# Patient Record
Sex: Male | Born: 1965 | Hispanic: Yes | Marital: Married | Smoking: Former smoker
Health system: Southern US, Community
[De-identification: ages and names within clinical notes are randomized; demographics above are authoritative.]

## PROBLEM LIST (undated history)

## (undated) DIAGNOSIS — E119 Type 2 diabetes mellitus without complications: Secondary | ICD-10-CM

## (undated) DIAGNOSIS — I10 Essential (primary) hypertension: Secondary | ICD-10-CM

## (undated) HISTORY — PX: HERNIA REPAIR: SHX51

---

## 2016-10-19 ENCOUNTER — Emergency Department
Admission: EM | Admit: 2016-10-19 | Discharge: 2016-10-19 | Disposition: A | Discharge status: Home / Self Care | Payer: BLUE CROSS/BLUE SHIELD | Attending: Emergency Medicine | Admitting: Emergency Medicine

## 2016-10-19 ENCOUNTER — Encounter: Payer: Self-pay | Admitting: Emergency Medicine

## 2016-10-19 DIAGNOSIS — R358 Other polyuria: Secondary | ICD-10-CM | POA: Diagnosis present

## 2016-10-19 DIAGNOSIS — Z87891 Personal history of nicotine dependence: Secondary | ICD-10-CM | POA: Insufficient documentation

## 2016-10-19 DIAGNOSIS — R739 Hyperglycemia, unspecified: Secondary | ICD-10-CM

## 2016-10-19 LAB — BASIC METABOLIC PANEL
ANION GAP: 8 (ref 5–15)
BUN: 13 mg/dL (ref 6–20)
CHLORIDE: 99 mmol/L — AB (ref 101–111)
CO2: 28 mmol/L (ref 22–32)
Calcium: 9.4 mg/dL (ref 8.9–10.3)
Creatinine, Ser: 0.67 mg/dL (ref 0.61–1.24)
GFR calc Af Amer: >60 mL/min (ref >60)
Glucose, Bld: 326 mg/dL — ABNORMAL HIGH (ref 65–99)
POTASSIUM: 4 mmol/L (ref 3.5–5.1)
SODIUM: 135 mmol/L (ref 135–145)

## 2016-10-19 LAB — URINALYSIS, COMPLETE (UACMP) WITH MICROSCOPIC
BACTERIA UA: NONE SEEN
BILIRUBIN URINE: NEGATIVE
Glucose, UA: >=500 mg/dL — AB
Hgb urine dipstick: NEGATIVE
KETONES UR: NEGATIVE mg/dL
LEUKOCYTES UA: NEGATIVE
NITRITE: NEGATIVE
PH: 5 (ref 5.0–8.0)
PROTEIN: NEGATIVE mg/dL
Specific Gravity, Urine: 1.033 — ABNORMAL HIGH (ref 1.005–1.030)
Squamous Epithelial / LPF: NONE SEEN

## 2016-10-19 LAB — CBC
HEMATOCRIT: 49.7 % (ref 40.0–52.0)
HEMOGLOBIN: 16.6 g/dL (ref 13.0–18.0)
MCH: 31.8 pg (ref 26.0–34.0)
MCHC: 33.3 g/dL (ref 32.0–36.0)
MCV: 95.6 fL (ref 80.0–100.0)
Platelets: 229 10*3/uL (ref 150–440)
RBC: 5.2 MIL/uL (ref 4.40–5.90)
RDW: 13.3 % (ref 11.5–14.5)
WBC: 10.2 10*3/uL (ref 3.8–10.6)

## 2016-10-19 LAB — GLUCOSE, CAPILLARY
GLUCOSE-CAPILLARY: 228 mg/dL — AB (ref 65–99)
Glucose-Capillary: 329 mg/dL — ABNORMAL HIGH (ref 65–99)

## 2016-10-19 MED ORDER — METFORMIN HCL 500 MG PO TABS: 500.0000 mg | ORAL_TABLET | Freq: Two times a day (BID) | ORAL | 0 refills | Status: DC

## 2016-10-19 MED ORDER — BLOOD GLUCOSE MONITOR KIT: PACK | 0 refills | Status: DC

## 2016-10-19 MED ORDER — SODIUM CHLORIDE 0.9 % IV BOLUS (SEPSIS)
1000.0000 mL | Freq: Once | INTRAVENOUS | Status: AC
  Administered 2016-10-19: 1000 mL via INTRAVENOUS

## 2016-10-19 NOTE — ED Triage Notes (Signed)
Pt to ED c/o polydipsia, urinary frequency, diaphoretic and blurry vision x4-5 days.  States a coworker checked blood sugar and was 557.  Denies being diabetic or using insulin.  Pt is A&Ox4, speaking in complete and coherent sentences, chest rise even and unlabored.

## 2016-10-19 NOTE — ED Provider Notes (Signed)
[facility] Emergency Department Provider Note  ____________________________________________  Time seen: Approximately 2:07 PM  I have reviewed the triage vital signs and the nursing notes.   HISTORY  Chief Complaint Hyperglycemia    HPI Roy Walker is a 51 y.o. male who complains of polyuria and polydipsia with some intermittent blurry vision for the past 4-5 days. No other complaints except for fatigue. A coworker checked his blood sugar and found that it was about 550. No history of diabetes or other medical problems. Currently not taking any medications.     History reviewed. No pertinent past medical history.   There are no active problems to display for this patient.    Past Surgical History:  Procedure Laterality Date  . HERNIA REPAIR       Prior to Admission medications   Medication Sig Start Date End Date Taking? Authorizing Provider  blood glucose meter kit and supplies KIT Dispense based on patient and insurance preference. Check blood sugar three times daily before meals. 10/19/16   [name], MD  metFORMIN (GLUCOPHAGE) 500 MG tablet Take 1 tablet (500 mg total) by mouth 2 (two) times daily with a meal. 10/19/16   [name], MD     Allergies Patient has no known allergies.   History reviewed. No pertinent family history.  Social History Social History  Substance Use Topics  . Smoking status: Former Smoker    Types: Cigarettes    Quit date: 10/19/2005  . Smokeless tobacco: Never Used  . Alcohol use 6.0 oz/week    10 Cans of beer per week    Review of Systems  Constitutional:   No fever or chills. Polydipsia ENT:   No sore throat. No rhinorrhea.Had a cold one week ago which is resolved. Cardiovascular:   No chest pain. Respiratory:   No dyspnea or cough. Gastrointestinal:   Negative for abdominal pain, vomiting and diarrhea.  Genitourinary:   Negative for dysuria or difficulty urinating.  Polyuria Musculoskeletal:   Negative for focal pain or swelling Neurological:   Negative for headaches 10-point ROS otherwise negative.  ____________________________________________   PHYSICAL EXAM:  VITAL SIGNS: ED Triage Vitals  Enc Vitals Group     BP 10/19/16 1119 (!) 182/93     Pulse Rate 10/19/16 1119 64     Resp 10/19/16 1119 16     Temp 10/19/16 1119 98.4 F (36.9 C)     Temp Source 10/19/16 1119 Oral     SpO2 10/19/16 1119 96 %     Weight 10/19/16 1120 293 lb (132.9 kg)     Height 10/19/16 1120 '5\' 7"'  (1.702 m)     Head Circumference --      Peak Flow --      Pain Score --      Pain Loc --      Pain Edu? --      Excl. in [place]? --     Vital signs reviewed, nursing assessments reviewed.   Constitutional:   Alert and oriented. Well appearing and in no distress. Eyes:   No scleral icterus. No conjunctival pallor. PERRL. EOMI.  No nystagmus. ENT   Head:   Normocephalic and atraumatic.   Nose:   No congestion/rhinnorhea. No septal hematoma   Mouth/Throat:   MMM, no pharyngeal erythema. No peritonsillar mass.    Neck:   No stridor. No SubQ emphysema. No meningismus. Hematological/Lymphatic/Immunilogical:   No cervical lymphadenopathy. Cardiovascular:   RRR. Symmetric bilateral radial and DP pulses.  No  murmurs.  Respiratory:   Normal respiratory effort without tachypnea nor retractions. Breath sounds are clear and equal bilaterally. No wheezes/rales/rhonchi. Gastrointestinal:   Soft and nontender. Non distended. There is no CVA tenderness.  No rebound, rigidity, or guarding. Genitourinary:   deferred Musculoskeletal:   Nontender with normal range of motion in all extremities. No joint effusions.  No lower extremity tenderness.  No edema. Neurologic:   Normal speech and language.  CN 2-10 normal. Motor grossly intact. No gross focal neurologic deficits are appreciated.  Skin:    Skin is warm, dry and intact. No rash noted.  No petechiae, purpura, or  bullae.  ____________________________________________    LABS (pertinent positives/negatives) (all labs ordered are listed, but only abnormal results are displayed) Labs Reviewed  GLUCOSE, CAPILLARY - Abnormal; Notable for the following:       Result Value   Glucose-Capillary 329 (*)    All other components within normal limits  BASIC METABOLIC PANEL - Abnormal; Notable for the following:    Chloride 99 (*)    Glucose, Bld 326 (*)    All other components within normal limits  URINALYSIS, COMPLETE (UACMP) WITH MICROSCOPIC - Abnormal; Notable for the following:    Color, Urine YELLOW (*)    APPearance CLEAR (*)    Specific Gravity, Urine 1.033 (*)    Glucose, UA >=500 (*)    All other components within normal limits  CBC  CBG MONITORING, ED   ____________________________________________   EKG    ____________________________________________    RADIOLOGY    ____________________________________________   PROCEDURES Procedures  ____________________________________________   INITIAL IMPRESSION / ASSESSMENT AND PLAN / ED COURSE  Pertinent labs & imaging results that were available during my care of the patient were reviewed by me and considered in my medical decision making (see chart for details).  Is well-appearing no acute distress, presents with hyperglycemia, about 3:30 in the ED. No evidence of acidosis. No evidence of infection or other precipitating cause. Not on steroids. Well-appearing with unremarkable vital signs at this time although blood pressure significantly elevated as well.  Advised the patient first and foremost he needs a primary care doctor. Refer him to [place] [facility] or whichever clinic works best for him. Start metformin 500 mg twice a day. Encourage the patient to start checking his blood sugar 3 times a day before meals and keep a log for primary care. Counseled patient on effects of unmanaged blood sugar including long-term damage to hard  brain and kidneys. Information provided on fluid management.       ____________________________________________   FINAL CLINICAL IMPRESSION(S) / ED DIAGNOSES  Final diagnoses:  Hyperglycemia  New-onset type 2 diabetes    New Prescriptions   BLOOD GLUCOSE METER KIT AND SUPPLIES KIT    Dispense based on patient and insurance preference. Check blood sugar three times daily before meals.   METFORMIN (GLUCOPHAGE) 500 MG TABLET    Take 1 tablet (500 mg total) by mouth 2 (two) times daily with a meal.     Portions of this note were generated with dragon dictation software. Dictation errors may occur despite best attempts at proofreading.    [name], MD 10/19/16 [phone]

## 2016-10-19 NOTE — ED Notes (Signed)
Pt. Verbalizes understanding of d/c instructions, prescriptions, and follow-up. VS stable and pt denies pain.  Pt. In NAD at time of d/c and denies further concerns regarding this visit. Pt. Stable at the time of departure from the unit, departing unit by the safest and most appropriate manner per that pt condition and limitations. Pt advised to return to the ED at any time for emergent concerns, or for new/worsening symptoms.   

## 2016-10-19 NOTE — ED Notes (Signed)
Pt states polydipsia, polyuria since Thursday. 557CBG 5 days ago.

## 2017-11-10 ENCOUNTER — Emergency Department
Admission: EM | Admit: 2017-11-10 | Discharge: 2017-11-10 | Disposition: A | Discharge status: Home / Self Care | Payer: BLUE CROSS/BLUE SHIELD | Attending: Emergency Medicine | Admitting: Emergency Medicine

## 2017-11-10 ENCOUNTER — Other Ambulatory Visit: Payer: Self-pay

## 2017-11-10 DIAGNOSIS — E119 Type 2 diabetes mellitus without complications: Secondary | ICD-10-CM | POA: Diagnosis not present

## 2017-11-10 DIAGNOSIS — K429 Umbilical hernia without obstruction or gangrene: Secondary | ICD-10-CM | POA: Insufficient documentation

## 2017-11-10 DIAGNOSIS — K469 Unspecified abdominal hernia without obstruction or gangrene: Secondary | ICD-10-CM

## 2017-11-10 DIAGNOSIS — Z87891 Personal history of nicotine dependence: Secondary | ICD-10-CM | POA: Diagnosis not present

## 2017-11-10 DIAGNOSIS — I10 Essential (primary) hypertension: Secondary | ICD-10-CM | POA: Diagnosis not present

## 2017-11-10 DIAGNOSIS — Z7984 Long term (current) use of oral hypoglycemic drugs: Secondary | ICD-10-CM | POA: Insufficient documentation

## 2017-11-10 HISTORY — DX: Essential (primary) hypertension: I10

## 2017-11-10 HISTORY — DX: Type 2 diabetes mellitus without complications: E11.9

## 2017-11-10 NOTE — ED Notes (Signed)
See triage note  States he has a umbilical hernia and is requesting a surgeon referral for hernia repair

## 2017-11-10 NOTE — ED Triage Notes (Signed)
Pt states he wants to set up for hernia repair. Denies having any pain but wants to get it fixed before it gets bad..asked if he has spoke with a Careers advisersurgeon and he said that's why his is here.

## 2017-11-10 NOTE — Discharge Instructions (Signed)
Advised to follow-up with family doctor for medical clearance before consult to general surgeon.

## 2017-11-10 NOTE — ED Provider Notes (Signed)
[facility] Emergency Department Provider Note   ____________________________________________   First MD Initiated Contact with Patient 11/10/17 1004     (approximate)  I have reviewed the triage vital signs and the nursing notes.   HISTORY  Chief Complaint Hernia    HPI Roy Walker is a 52 y.o. male patient requesting consult the surgical consult secondary to umbilical hand area.  Patient was seen in December 2018 by family doctor and he was told a consult was generated.  Patient state he never heard from the surgical clinic.  Instead of contacting his doctor or come to the emergency room to request consult.  Patient denies abdominal pain with his hernia.Patient state umbilical hernia increased in size in the past 6 months.   Past Medical History:  Diagnosis Date  . Diabetes mellitus without complication ([place])   . Hypertension     There are no active problems to display for this patient.   Past Surgical History:  Procedure Laterality Date  . HERNIA REPAIR      Prior to Admission medications   Medication Sig Start Date End Date Taking? Authorizing Provider  blood glucose meter kit and supplies KIT Dispense based on patient and insurance preference. Check blood sugar three times daily before meals. 10/19/16   [name], MD  metFORMIN (GLUCOPHAGE) 500 MG tablet Take 1 tablet (500 mg total) by mouth 2 (two) times daily with a meal. 10/19/16   [name], MD    Allergies Patient has no known allergies.  No family history on file.  Social History Social History   Tobacco Use  . Smoking status: Former Smoker    Types: Cigarettes    Last attempt to quit: 10/19/2005    Years since quitting: 12.0  . Smokeless tobacco: Never Used  Substance Use Topics  . Alcohol use: Yes    Alcohol/week: 6.0 oz    Types: 10 Cans of beer per week  . Drug use: No    Review of Systems Constitutional: No fever/chills Eyes: No visual  changes. ENT: No sore throat. Cardiovascular: Denies chest pain. Respiratory: Denies shortness of breath. Gastrointestinal: No abdominal pain.  No nausea, no vomiting.  No diarrhea.  No constipation. Genitourinary: Negative for dysuria. Musculoskeletal: Negative for back pain. Skin: Negative for rash. Neurological: Negative for headaches, focal weakness or numbness. Endocrine:Diabetes and hypertension ____________________________________________   PHYSICAL EXAM:  VITAL SIGNS: ED Triage Vitals  Enc Vitals Group     BP 11/10/17 0900 (!) 198/95     Pulse Rate 11/10/17 0900 80     Resp 11/10/17 0900 18     Temp 11/10/17 0900 98.3 F (36.8 C)     Temp Source 11/10/17 0900 Oral     SpO2 11/10/17 0900 94 %     Weight 11/10/17 0906 230 lb (104.3 kg)     Height 11/10/17 0906 '5\' 7"'  (1.702 m)     Head Circumference --      Peak Flow --      Pain Score --      Pain Loc --      Pain Edu? --      Excl. in [place]? --    Constitutional: Alert and oriented. Well appearing and in no acute distress.  Overweight. Cardiovascular: Normal rate, regular rhythm. Grossly normal heart sounds.  Good peripheral circulation.  Elevated blood pressure Respiratory: Normal respiratory effort.  No retractions. Lungs CTAB. Gastrointestinal: Umbilical hernia.  Soft and nontender. No distention. No abdominal bruits. No  CVA tenderness.   ____________________________________________   LABS (all labs ordered are listed, but only abnormal results are displayed)  Labs Reviewed - No data to display ____________________________________________  EKG   ____________________________________________  RADIOLOGY  ED MD interpretation:    Official radiology report(s): No results found.  ____________________________________________   PROCEDURES  Procedure(s) performed: None  Procedures  Critical Care performed: No  ____________________________________________   INITIAL IMPRESSION / ASSESSMENT AND  PLAN / ED COURSE  As part of my medical decision making, I reviewed the following data within the [place]    Umbilical hernia and elevated blood pressure.  Advised patient he would need to be cleared by PCP due to his hypertension and history of diabetes.  Advised to have resubmit his consult.     ____________________________________________   FINAL CLINICAL IMPRESSION(S) / ED DIAGNOSES  Final diagnoses:  Abdominal hernia without obstruction and without gangrene, recurrence not specified, unspecified hernia type     ED Discharge Orders    None       Note:  This document was prepared using Dragon voice recognition software and may include unintentional dictation errors.    Sable Feil, PA-C 11/10/17 1028    [name], MD 11/10/17 [phone]

## 2018-05-01 ENCOUNTER — Ambulatory Visit: Payer: Self-pay | Admitting: Surgery

## 2018-05-01 NOTE — H&P (View-Only) (Signed)
Subjective:   CC: umbilical hernia.  HPI:  Roy Walker is a 52 y.o. male who was referred by Dr. Marcello FennelHande for evaluation of  Umbilical hernia. First noted 3 year ago. did not start at work. Asymptomatic.  Lump is reducible. Patient state no change in BM or urinary habits   Past Medical History:  has a past medical history of Diabetes mellitus type 2, uncomplicated (CMS-HCC), Hypertension, and Obesity (BMI 30-39.9), unspecified (03/14/2018).  Past Surgical History:  has a past surgical history that includes Repair Inguinal Hernia (Left).  Family History: family history is not on file.  Social History:  reports that he quit smoking about 12 years ago. His smoking use included cigarettes. He has never used smokeless tobacco. He reports that he drinks about 3.6 oz of alcohol per week. His drug history is not on file.  Current Medications: has a current medication list which includes the following prescription(s): aspirin, glimepiride, losartan, and metformin.  Allergies:  No Known Allergies  ROS: 14 systems reviewed pertinent positives and negatives as noted in the HPI.    Objective:   BP (!) 179/116   Pulse 87   Temp 37.6 C (99.7 F) (Oral)   Ht 170.2 cm (5\' 7" )   Wt (!) 103.9 kg (229 lb)   BMI 35.87 kg/m   Constitutional :  alert, appears stated age, cooperative and no distress  Throat:  no asymmetry, masses, or scars  Respiratory:  clear to auscultation bilaterally  Cardiovascular:  regular rate and rhythm, S1, S2 normal, no murmur, click, rub or gallop  Gastrointestinal: soft, non-tender; bowel sounds normal; no masses,  no organomegaly and umbilical hernia noted, easily reducible in supine but herniates with sitting.  obese, with evidence of diastasis recti as well, measuring approximately 3cm..   Musculoskeletal: Steady gait and movement  Skin: Cool and moist, left inguinal surgical scars  Psychiatric: Normal affect, non-agitated, not confused       LABS:   -      Lab Results  Component Value Date   WBC 9.8 12/14/2017   HGB 15.9 12/14/2017   HCT 48.5 12/14/2017   PLT 246 12/14/2017   -      Lab Results  Component Value Date   NA 141 12/14/2017   K 4.2 12/14/2017   CL 101 12/14/2017   CO2 32.7 (H) 12/14/2017   BUN 16 12/14/2017   CREATININE 0.9 12/14/2017   CALCIUM 10.3 12/14/2017   ALB 4.7 12/14/2017   TBILI 0.6 12/14/2017   ALKPHOS 71 12/14/2017   AST 38 12/14/2017   ALT 78 (H) 12/14/2017   GLUCOSE 195 (H) 12/14/2017   GFR 89 12/14/2017   -      Lab Results  Component Value Date   CHOLTOTAL 235 (H) 12/14/2017   TRIG 324 (H) 12/14/2017   HDL 46.0 12/14/2017   LDLCALC 124 12/14/2017   -      Lab Results  Component Value Date   HGBA1C 9.6 (H) 12/14/2017     RADS:  Assessment:     reducible moderate size umbilical hernia, obesity, DM, HTN  Plan:   1. Alternatives include continued observation.  Benefits include possible symptom relief, prevention of incarceration, strangulation, enlargement in size over time, and the risk of emergency surgery in the face of strangulation.   Discussed the risk of surgery including recurrence, which can be up to 50% in the case of incisional or complex hernias, possible use of prosthetic materials (mesh) and the increased risk of mesh  infxn if used, chronic pain, post-op infxn, post-op SBO or ileus, and possible re-operation to address said risks. The risks of general anesthetic, if used, includes MI, CVA, sudden death or even reaction to anesthetic medications also discussed.  Typical post-op recovery time of 3-5 days with 6 weeks of activity restrictions were also discussed.  The patient verbalized understanding and all questions were answered to the patient's satisfaction.  ED return precautions given for sudden increase in pain, size of hernia with accompanying fever, nausea, and/or vomiting.  2. Patient has elected to proceed with surgical  treatment. Procedure will be scheduled once short-term disability paperwork is completed for his work, which requires regular lifting of greater than 30lbs.  Written consent was obtained.  3. Obesity -discussed increased risk of complications including recurrence.  Pt requests to proceed with surgery so he can lose weight.  4. DM -recently changed med regimen.  Continue current meds  5. HTN -known to be measured higher at provider office per patient report.  Continue to monitor and take meds as prescribed.   Of the 35 minutes I spent with the patient, 15 minutes were spent discussing pathophysiology, risks, benefits, alternatives to the treatment options     Electronically signed by Sung Amabile, DO on 03/14/2018 9:27 AM

## 2018-05-01 NOTE — H&P (Signed)
Subjective:   CC: umbilical hernia.  HPI:  Roy Walker is a 52 y.o. male who was referred by Dr. Hande for evaluation of  Umbilical hernia. First noted 3 year ago. did not start at work. Asymptomatic.  Lump is reducible. Patient state no change in BM or urinary habits   Past Medical History:  has a past medical history of Diabetes mellitus type 2, uncomplicated (CMS-HCC), Hypertension, and Obesity (BMI 30-39.9), unspecified (03/14/2018).  Past Surgical History:  has a past surgical history that includes Repair Inguinal Hernia (Left).  Family History: family history is not on file.  Social History:  reports that he quit smoking about 12 years ago. His smoking use included cigarettes. He has never used smokeless tobacco. He reports that he drinks about 3.6 oz of alcohol per week. His drug history is not on file.  Current Medications: has a current medication list which includes the following prescription(s): aspirin, glimepiride, losartan, and metformin.  Allergies:  No Known Allergies  ROS: 14 systems reviewed pertinent positives and negatives as noted in the HPI.    Objective:   BP (!) 179/116   Pulse 87   Temp 37.6 C (99.7 F) (Oral)   Ht 170.2 cm (5' 7")   Wt (!) 103.9 kg (229 lb)   BMI 35.87 kg/m   Constitutional :  alert, appears stated age, cooperative and no distress  Throat:  no asymmetry, masses, or scars  Respiratory:  clear to auscultation bilaterally  Cardiovascular:  regular rate and rhythm, S1, S2 normal, no murmur, click, rub or gallop  Gastrointestinal: soft, non-tender; bowel sounds normal; no masses,  no organomegaly and umbilical hernia noted, easily reducible in supine but herniates with sitting.  obese, with evidence of diastasis recti as well, measuring approximately 3cm..   Musculoskeletal: Steady gait and movement  Skin: Cool and moist, left inguinal surgical scars  Psychiatric: Normal affect, non-agitated, not confused       LABS:   -      Lab Results  Component Value Date   WBC 9.8 12/14/2017   HGB 15.9 12/14/2017   HCT 48.5 12/14/2017   PLT 246 12/14/2017   -      Lab Results  Component Value Date   NA 141 12/14/2017   K 4.2 12/14/2017   CL 101 12/14/2017   CO2 32.7 (H) 12/14/2017   BUN 16 12/14/2017   CREATININE 0.9 12/14/2017   CALCIUM 10.3 12/14/2017   ALB 4.7 12/14/2017   TBILI 0.6 12/14/2017   ALKPHOS 71 12/14/2017   AST 38 12/14/2017   ALT 78 (H) 12/14/2017   GLUCOSE 195 (H) 12/14/2017   GFR 89 12/14/2017   -      Lab Results  Component Value Date   CHOLTOTAL 235 (H) 12/14/2017   TRIG 324 (H) 12/14/2017   HDL 46.0 12/14/2017   LDLCALC 124 12/14/2017   -      Lab Results  Component Value Date   HGBA1C 9.6 (H) 12/14/2017     RADS:  Assessment:     reducible moderate size umbilical hernia, obesity, DM, HTN  Plan:   1. Alternatives include continued observation.  Benefits include possible symptom relief, prevention of incarceration, strangulation, enlargement in size over time, and the risk of emergency surgery in the face of strangulation.   Discussed the risk of surgery including recurrence, which can be up to 50% in the case of incisional or complex hernias, possible use of prosthetic materials (mesh) and the increased risk of mesh   infxn if used, chronic pain, post-op infxn, post-op SBO or ileus, and possible re-operation to address said risks. The risks of general anesthetic, if used, includes MI, CVA, sudden death or even reaction to anesthetic medications also discussed.  Typical post-op recovery time of 3-5 days with 6 weeks of activity restrictions were also discussed.  The patient verbalized understanding and all questions were answered to the patient's satisfaction.  ED return precautions given for sudden increase in pain, size of hernia with accompanying fever, nausea, and/or vomiting.  2. Patient has elected to proceed with surgical  treatment. Procedure will be scheduled once short-term disability paperwork is completed for his work, which requires regular lifting of greater than 30lbs.  Written consent was obtained.  3. Obesity -discussed increased risk of complications including recurrence.  Pt requests to proceed with surgery so he can lose weight.  4. DM -recently changed med regimen.  Continue current meds  5. HTN -known to be measured higher at provider office per patient report.  Continue to monitor and take meds as prescribed.   Of the 35 minutes I spent with the patient, 15 minutes were spent discussing pathophysiology, risks, benefits, alternatives to the treatment options     Electronically signed by Abygayle Deltoro, DO on 03/14/2018 9:27 AM     

## 2018-05-09 ENCOUNTER — Inpatient Hospital Stay: Admission: RE | Admit: 2018-05-09 | Payer: BLUE CROSS/BLUE SHIELD | Source: Ambulatory Visit

## 2018-05-11 ENCOUNTER — Inpatient Hospital Stay: Admission: RE | Admit: 2018-05-11 | Payer: BLUE CROSS/BLUE SHIELD | Source: Ambulatory Visit

## 2018-05-17 MED ORDER — CEFAZOLIN SODIUM-DEXTROSE 2-4 GM/100ML-% IV SOLN: 2.0000 g | INTRAVENOUS | Status: DC

## 2018-05-18 ENCOUNTER — Ambulatory Visit
Admission: RE | Admit: 2018-05-18 | Discharge: 2018-05-18 | Disposition: A | Discharge status: Home / Self Care | Payer: BLUE CROSS/BLUE SHIELD | Source: Ambulatory Visit | Attending: Surgery | Admitting: Surgery

## 2018-05-18 ENCOUNTER — Encounter
Admission: RE | Disposition: A | Discharge status: Home / Self Care | Payer: Self-pay | Source: Ambulatory Visit | Attending: Surgery

## 2018-05-18 ENCOUNTER — Ambulatory Visit: Payer: BLUE CROSS/BLUE SHIELD

## 2018-05-18 ENCOUNTER — Other Ambulatory Visit: Payer: Self-pay

## 2018-05-18 DIAGNOSIS — Z7984 Long term (current) use of oral hypoglycemic drugs: Secondary | ICD-10-CM | POA: Insufficient documentation

## 2018-05-18 DIAGNOSIS — Z87891 Personal history of nicotine dependence: Secondary | ICD-10-CM | POA: Diagnosis not present

## 2018-05-18 DIAGNOSIS — Z6833 Body mass index (BMI) 33.0-33.9, adult: Secondary | ICD-10-CM | POA: Diagnosis not present

## 2018-05-18 DIAGNOSIS — K420 Umbilical hernia with obstruction, without gangrene: Secondary | ICD-10-CM | POA: Diagnosis not present

## 2018-05-18 DIAGNOSIS — Z79899 Other long term (current) drug therapy: Secondary | ICD-10-CM | POA: Insufficient documentation

## 2018-05-18 DIAGNOSIS — Z7982 Long term (current) use of aspirin: Secondary | ICD-10-CM | POA: Insufficient documentation

## 2018-05-18 DIAGNOSIS — E119 Type 2 diabetes mellitus without complications: Secondary | ICD-10-CM | POA: Insufficient documentation

## 2018-05-18 DIAGNOSIS — I10 Essential (primary) hypertension: Secondary | ICD-10-CM | POA: Diagnosis not present

## 2018-05-18 DIAGNOSIS — E669 Obesity, unspecified: Secondary | ICD-10-CM | POA: Diagnosis not present

## 2018-05-18 LAB — GLUCOSE, CAPILLARY: Glucose-Capillary: 321 mg/dL — ABNORMAL HIGH (ref 70–99)

## 2018-05-18 SURGERY — REPAIR, HERNIA, UMBILICAL, ADULT
Anesthesia: Choice

## 2018-05-18 MED ORDER — PHENYLEPHRINE HCL 10 MG/ML IJ SOLN
INTRAMUSCULAR | Status: AC
  Filled 2018-05-18: qty 1

## 2018-05-18 MED ORDER — SEVOFLURANE IN SOLN
RESPIRATORY_TRACT | Status: AC
  Filled 2018-05-18: qty 250

## 2018-05-18 MED ORDER — INSULIN ASPART 100 UNIT/ML ~~LOC~~ SOLN
10.0000 [IU] | Freq: Once | SUBCUTANEOUS | Status: AC
  Administered 2018-05-18: 10 [IU] via SUBCUTANEOUS

## 2018-05-18 MED ORDER — PROPOFOL 10 MG/ML IV BOLUS
INTRAVENOUS | Status: AC
  Filled 2018-05-18: qty 20

## 2018-05-18 MED ORDER — ACETAMINOPHEN 500 MG PO TABS
1000.0000 mg | ORAL_TABLET | ORAL | Status: AC
  Administered 2018-05-18: 1000 mg via ORAL

## 2018-05-18 MED ORDER — SUCCINYLCHOLINE CHLORIDE 20 MG/ML IJ SOLN
INTRAMUSCULAR | Status: AC
  Filled 2018-05-18: qty 1

## 2018-05-18 MED ORDER — FENTANYL CITRATE (PF) 250 MCG/5ML IJ SOLN
INTRAMUSCULAR | Status: AC
  Filled 2018-05-18: qty 5

## 2018-05-18 MED ORDER — BUPIVACAINE-EPINEPHRINE (PF) 0.5% -1:200000 IJ SOLN
INTRAMUSCULAR | Status: AC
  Filled 2018-05-18: qty 30

## 2018-05-18 MED ORDER — SODIUM CHLORIDE 0.9 % IV SOLN
INTRAVENOUS | Status: DC
  Administered 2018-05-18: 08:00:00 via INTRAVENOUS

## 2018-05-18 MED ORDER — ONDANSETRON HCL 4 MG/2ML IJ SOLN
INTRAMUSCULAR | Status: AC
  Filled 2018-05-18: qty 2

## 2018-05-18 MED ORDER — MIDAZOLAM HCL 2 MG/2ML IJ SOLN
INTRAMUSCULAR | Status: AC
  Filled 2018-05-18: qty 2

## 2018-05-18 MED ORDER — ACETAMINOPHEN 500 MG PO TABS
ORAL_TABLET | ORAL | Status: AC
  Administered 2018-05-18: 1000 mg via ORAL
  Filled 2018-05-18: qty 2

## 2018-05-18 MED ORDER — DEXAMETHASONE SODIUM PHOSPHATE 10 MG/ML IJ SOLN
INTRAMUSCULAR | Status: AC
  Filled 2018-05-18: qty 1

## 2018-05-18 MED ORDER — BUPIVACAINE LIPOSOME 1.3 % IJ SUSP
INTRAMUSCULAR | Status: AC
  Filled 2018-05-18: qty 20

## 2018-05-18 MED ORDER — CEFAZOLIN SODIUM-DEXTROSE 2-4 GM/100ML-% IV SOLN
INTRAVENOUS | Status: AC
  Filled 2018-05-18: qty 100

## 2018-05-18 MED ORDER — INSULIN ASPART 100 UNIT/ML ~~LOC~~ SOLN
SUBCUTANEOUS | Status: AC
  Administered 2018-05-18: 10 [IU] via SUBCUTANEOUS
  Filled 2018-05-18: qty 1

## 2018-05-18 MED ORDER — ROCURONIUM BROMIDE 50 MG/5ML IV SOLN
INTRAVENOUS | Status: AC
  Filled 2018-05-18: qty 1

## 2018-05-18 MED ORDER — LIDOCAINE HCL (PF) 2 % IJ SOLN
INTRAMUSCULAR | Status: AC
  Filled 2018-05-18: qty 10

## 2018-05-18 MED ORDER — CHLORHEXIDINE GLUCONATE CLOTH 2 % EX PADS
6.0000 | MEDICATED_PAD | Freq: Once | CUTANEOUS | Status: AC
  Administered 2018-05-18: 6 via TOPICAL

## 2018-05-18 SURGICAL SUPPLY — 30 items
BLADE SURG 15 STRL LF DISP TIS (BLADE) ×1 IMPLANT
BLADE SURG 15 STRL SS (BLADE) ×2
CANISTER SUCT 1200ML W/VALVE (MISCELLANEOUS) ×3 IMPLANT
CHLORAPREP W/TINT 26ML (MISCELLANEOUS) ×3 IMPLANT
DERMABOND ADVANCED (GAUZE/BANDAGES/DRESSINGS) ×2
DERMABOND ADVANCED .7 DNX12 (GAUZE/BANDAGES/DRESSINGS) ×1 IMPLANT
DRAPE LAPAROTOMY 77X122 PED (DRAPES) ×3 IMPLANT
ELECT REM PT RETURN 9FT ADLT (ELECTROSURGICAL) ×3
ELECTRODE REM PT RTRN 9FT ADLT (ELECTROSURGICAL) ×1 IMPLANT
GLOVE BIOGEL PI IND STRL 7.0 (GLOVE) ×1 IMPLANT
GLOVE BIOGEL PI INDICATOR 7.0 (GLOVE) ×2
GLOVE SURG SYN 6.5 ES PF (GLOVE) ×3 IMPLANT
GOWN STRL REUS W/ TWL LRG LVL3 (GOWN DISPOSABLE) ×3 IMPLANT
GOWN STRL REUS W/TWL LRG LVL3 (GOWN DISPOSABLE) ×6
KIT TURNOVER KIT A (KITS) ×3 IMPLANT
LABEL OR SOLS (LABEL) ×3 IMPLANT
NEEDLE HYPO 22GX1.5 SAFETY (NEEDLE) ×3 IMPLANT
NS IRRIG 500ML POUR BTL (IV SOLUTION) ×3 IMPLANT
PACK BASIN MINOR ARMC (MISCELLANEOUS) ×3 IMPLANT
SUT ETHIBOND NAB MO 7 #0 18IN (SUTURE) ×3 IMPLANT
SUT MNCRL 4-0 (SUTURE) ×2
SUT MNCRL 4-0 27XMFL (SUTURE) ×1
SUT VIC AB 2-0 SH 27 (SUTURE) ×2
SUT VIC AB 2-0 SH 27XBRD (SUTURE) ×1 IMPLANT
SUT VIC AB 3-0 SH 27 (SUTURE) ×2
SUT VIC AB 3-0 SH 27X BRD (SUTURE) ×1 IMPLANT
SUTURE MNCRL 4-0 27XMF (SUTURE) ×1 IMPLANT
SYR 10ML LL (SYRINGE) ×6 IMPLANT
TOWEL OR 17X26 4PK STRL BLUE (TOWEL DISPOSABLE) ×3 IMPLANT
WATER STERILE IRR 1000ML POUR (IV SOLUTION) ×3 IMPLANT

## 2018-05-18 NOTE — OR Nursing (Signed)
The patient had a bite of watermelon at 0500 with a sip of water. Surgery cancelled.

## 2018-05-18 NOTE — Interval H&P Note (Signed)
History and Physical Interval Note:  05/18/2018 8:23 AM  Roy Walker  has presented today for surgery, with the diagnosis of umbilical hernia  The various methods of treatment have been discussed with the patient and family. After consideration of risks, benefits and other options for treatment, the patient has consented to  Procedure(s): HERNIA REPAIR UMBILICAL ADULT (N/A) as a surgical intervention .  The patient's history has been reviewed, patient examined, no change in status, stable for surgery.  I have reviewed the patient's chart and labs.  Questions were answered to the patient's satisfaction.     Miche Loughridge Tonna BoehringerSakai

## 2018-05-18 NOTE — Anesthesia Preprocedure Evaluation (Signed)
Anesthesia Evaluation    Airway        Dental   Pulmonary former smoker,           Cardiovascular hypertension,      Neuro/Psych    GI/Hepatic   Endo/Other  diabetes  Renal/GU      Musculoskeletal   Abdominal   Peds  Hematology   Anesthesia Other Findings   Reproductive/Obstetrics                             Anesthesia Physical Anesthesia Plan Anesthesia Quick Evaluation  

## 2018-05-24 ENCOUNTER — Encounter
Admission: RE | Admit: 2018-05-24 | Discharge: 2018-05-24 | Disposition: A | Discharge status: Home / Self Care | Payer: BLUE CROSS/BLUE SHIELD | Source: Ambulatory Visit | Attending: Surgery | Admitting: Surgery

## 2018-05-24 ENCOUNTER — Ambulatory Visit: Payer: Self-pay | Admitting: Surgery

## 2018-05-24 DIAGNOSIS — Z01818 Encounter for other preprocedural examination: Secondary | ICD-10-CM | POA: Insufficient documentation

## 2018-05-24 NOTE — Patient Instructions (Addendum)
Your procedure is scheduled on: Friday 05/26/18  Report to DAY SURGERY DEPARTMENT LOCATED ON 2ND FLOOR MEDICAL MALL ENTRANCE. To find out your arrival time please call [phone] between 1PM - 3PM on Thursday 05/25/18  Remember: Instructions that are not followed completely may result in serious medical risk, up to and including death, or upon the discretion of your surgeon and anesthesiologist your surgery may need to be rescheduled.     _X__ 1. Do not eat food after midnight the night before your procedure.                 No gum chewing or hard candies. You may drink SUGAR FREE clear liquids up to 2 hours                 before you are scheduled to arrive for your surgery- DO NOT drink clear                 liquids within 2 hours of the start of your surgery.                  __X__2.  On the morning of surgery brush your teeth with toothpaste and water, you may rinse your mouth with mouthwash if you wish.  Do not swallow any toothpaste of mouthwash.     _X__ 3.  No Alcohol for 24 hours before or after surgery.   _X__ 4.  Do Not Smoke or use e-cigarettes For 24 Hours Prior to Your Surgery.                 Do not use any chewable tobacco products for at least 6 hours prior to                  surgery.  ____   5.  Bring all medications with you on the day of surgery if instructed.   __X__ 6.  Notify your doctor if there is any change in your medical condition      (cold, fever, infections).     Do not wear jewelry, make-up, hairpins, clips or nail polish. Do not wear lotions, powders, or perfumes.  Do not shave 48 hours prior to surgery. Men may shave face and neck. Do not bring valuables to the hospital.    [facility] is not responsible for any belongings or valuables.  Contacts, dentures/partials or body piercings may not be worn into surgery. Bring a case for your contacts, glasses or hearing aids, a denture cup will be supplied.   Patients discharged the day of surgery will  not be allowed to drive home.   Please read over the following fact sheets that you were given:   MRSA Information  __X__ Take these medicines the morning of surgery with A SIP OF WATER:     1. fenofibrate (TRICOR) 48 MG tablet  2.   3.  4.  5.  6.     __X__ Use CHG Soap as directed   __X__ Stop metformin TODAY    __X__ Stop Blood Thinners Coumadin/Plavix/Xarelto/Pleta/Pradaxa/Eliquis/Effient/Aspirin TODAY  __X__ Stop Anti-inflammatories 7 days before surgery such as Advil, Ibuprofen, Motrin, BC or Goodies Powder, Naprosyn, Naproxen, Aleve, Aspirin, Meloxicam. May take Tylenol if needed for pain or discomfort.   __X__ Stop all herbal supplements, fish oil or vitamin E until after surgery. TODAY

## 2018-05-24 NOTE — H&P (Signed)
Subjective:   CC: umbilical hernia.  HPI:  Roy Walker is a 52 y.o. male who was referred by Dr. Marcello Fennel for evaluation of  Umbilical hernia. First noted 3 year ago. did not start at work. Asymptomatic.  Lump is reducible. Patient state no change in BM or urinary habits   Past Medical History:  has a past medical history of Diabetes mellitus type 2, uncomplicated (CMS-HCC), Hypertension, and Obesity (BMI 30-39.9), unspecified (03/14/2018).  Past Surgical History:  has a past surgical history that includes Repair Inguinal Hernia (Left).  Family History: family history is not on file.  Social History:  reports that he quit smoking about 12 years ago. His smoking use included cigarettes. He has never used smokeless tobacco. He reports that he drinks about 3.6 oz of alcohol per week. His drug history is not on file.  Current Medications: has a current medication list which includes the following prescription(s): aspirin, glimepiride, losartan, and metformin.  Allergies:  No Known Allergies  ROS: 14 systems reviewed pertinent positives and negatives as noted in the HPI.    Objective:   BP (!) 179/116   Pulse 87   Temp 37.6 C (99.7 F) (Oral)   Ht 170.2 cm (5\' 7" )   Wt (!) 103.9 kg (229 lb)   BMI 35.87 kg/m   Constitutional :  alert, appears stated age, cooperative and no distress  Throat:  no asymmetry, masses, or scars  Respiratory:  clear to auscultation bilaterally  Cardiovascular:  regular rate and rhythm, S1, S2 normal, no murmur, click, rub or gallop  Gastrointestinal: soft, non-tender; bowel sounds normal; no masses,  no organomegaly and umbilical hernia noted, easily reducible in supine but herniates with sitting.  obese, with evidence of diastasis recti as well, measuring approximately 3cm..   Musculoskeletal: Steady gait and movement  Skin: Cool and moist, left inguinal surgical scars  Psychiatric: Normal affect, non-agitated, not confused       LABS:   -      Lab Results  Component Value Date   WBC 9.8 12/14/2017   HGB 15.9 12/14/2017   HCT 48.5 12/14/2017   PLT 246 12/14/2017   -      Lab Results  Component Value Date   NA 141 12/14/2017   K 4.2 12/14/2017   CL 101 12/14/2017   CO2 32.7 (H) 12/14/2017   BUN 16 12/14/2017   CREATININE 0.9 12/14/2017   CALCIUM 10.3 12/14/2017   ALB 4.7 12/14/2017   TBILI 0.6 12/14/2017   ALKPHOS 71 12/14/2017   AST 38 12/14/2017   ALT 78 (H) 12/14/2017   GLUCOSE 195 (H) 12/14/2017   GFR 89 12/14/2017   -      Lab Results  Component Value Date   CHOLTOTAL 235 (H) 12/14/2017   TRIG 324 (H) 12/14/2017   HDL 46.0 12/14/2017   LDLCALC 124 12/14/2017   -      Lab Results  Component Value Date   HGBA1C 9.6 (H) 12/14/2017     RADS:  Assessment:     reducible moderate size umbilical hernia, obesity, DM, HTN  Plan:   1. Alternatives include continued observation.  Benefits include possible symptom relief, prevention of incarceration, strangulation, enlargement in size over time, and the risk of emergency surgery in the face of strangulation.   Discussed the risk of surgery including recurrence, which can be up to 50% in the case of incisional or complex hernias, possible use of prosthetic materials (mesh) and the increased risk of mesh  infxn if used, chronic pain, post-op infxn, post-op SBO or ileus, and possible re-operation to address said risks. The risks of general anesthetic, if used, includes MI, CVA, sudden death or even reaction to anesthetic medications also discussed.  Typical post-op recovery time of 3-5 days with 6 weeks of activity restrictions were also discussed.  The patient verbalized understanding and all questions were answered to the patient's satisfaction.  ED return precautions given for sudden increase in pain, size of hernia with accompanying fever, nausea, and/or vomiting.  2. Patient has elected to proceed with surgical  treatment. Procedure will be scheduled once short-term disability paperwork is completed for his work, which requires regular lifting of greater than 30lbs.  Written consent was obtained.  3. Obesity -discussed increased risk of complications including recurrence.  Pt requests to proceed with surgery so he can lose weight.  4. DM -recently changed med regimen.  Continue current meds  5. HTN -known to be measured higher at provider office per patient report.  Continue to monitor and take meds as prescribed.   Of the 35 minutes I spent with the patient, 15 minutes were spent discussing pathophysiology, risks, benefits, alternatives to the treatment options     Electronically signed by [name], Brenner Visconti, DO on 03/14/2018 9:27 AM

## 2018-05-24 NOTE — H&P (View-Only) (Signed)
Subjective:   CC: umbilical hernia.  HPI:  Roy Walker is a 52 y.o. male who was referred by Dr. Marcello Walker for evaluation of  Umbilical hernia. First noted 3 year ago. did not start at work. Asymptomatic.  Lump is reducible. Patient state no change in BM or urinary habits   Past Medical History:  has a past medical history of Diabetes mellitus type 2, uncomplicated (CMS-HCC), Hypertension, and Obesity (BMI 30-39.9), unspecified (03/14/2018).  Past Surgical History:  has a past surgical history that includes Repair Inguinal Hernia (Left).  Family History: family history is not on file.  Social History:  reports that he quit smoking about 12 years ago. His smoking use included cigarettes. He has never used smokeless tobacco. He reports that he drinks about 3.6 oz of alcohol per week. His drug history is not on file.  Current Medications: has a current medication list which includes the following prescription(s): aspirin, glimepiride, losartan, and metformin.  Allergies:  No Known Allergies  ROS: 14 systems reviewed pertinent positives and negatives as noted in the HPI.    Objective:   BP (!) 179/116   Pulse 87   Temp 37.6 C (99.7 F) (Oral)   Ht 170.2 cm (5\' 7" )   Wt (!) 103.9 kg (229 lb)   BMI 35.87 kg/m   Constitutional :  alert, appears stated age, cooperative and no distress  Throat:  no asymmetry, masses, or scars  Respiratory:  clear to auscultation bilaterally  Cardiovascular:  regular rate and rhythm, S1, S2 normal, no murmur, click, rub or gallop  Gastrointestinal: soft, non-tender; bowel sounds normal; no masses,  no organomegaly and umbilical hernia noted, easily reducible in supine but herniates with sitting.  obese, with evidence of diastasis recti as well, measuring approximately 3cm..   Musculoskeletal: Steady gait and movement  Skin: Cool and moist, left inguinal surgical scars  Psychiatric: Normal affect, non-agitated, not confused       LABS:   -      Lab Results  Component Value Date   WBC 9.8 12/14/2017   HGB 15.9 12/14/2017   HCT 48.5 12/14/2017   PLT 246 12/14/2017   -      Lab Results  Component Value Date   NA 141 12/14/2017   K 4.2 12/14/2017   CL 101 12/14/2017   CO2 32.7 (H) 12/14/2017   BUN 16 12/14/2017   CREATININE 0.9 12/14/2017   CALCIUM 10.3 12/14/2017   ALB 4.7 12/14/2017   TBILI 0.6 12/14/2017   ALKPHOS 71 12/14/2017   AST 38 12/14/2017   ALT 78 (H) 12/14/2017   GLUCOSE 195 (H) 12/14/2017   GFR 89 12/14/2017   -      Lab Results  Component Value Date   CHOLTOTAL 235 (H) 12/14/2017   TRIG 324 (H) 12/14/2017   HDL 46.0 12/14/2017   LDLCALC 124 12/14/2017   -      Lab Results  Component Value Date   HGBA1C 9.6 (H) 12/14/2017     RADS:  Assessment:     reducible moderate size umbilical hernia, obesity, DM, HTN  Plan:   1. Alternatives include continued observation.  Benefits include possible symptom relief, prevention of incarceration, strangulation, enlargement in size over time, and the risk of emergency surgery in the face of strangulation.   Discussed the risk of surgery including recurrence, which can be up to 50% in the case of incisional or complex hernias, possible use of prosthetic materials (mesh) and the increased risk of mesh  infxn if used, chronic pain, post-op infxn, post-op SBO or ileus, and possible re-operation to address said risks. The risks of general anesthetic, if used, includes MI, CVA, sudden death or even reaction to anesthetic medications also discussed.  Typical post-op recovery time of 3-5 days with 6 weeks of activity restrictions were also discussed.  The patient verbalized understanding and all questions were answered to the patient's satisfaction.  ED return precautions given for sudden increase in pain, size of hernia with accompanying fever, nausea, and/or vomiting.  2. Patient has elected to proceed with surgical  treatment. Procedure will be scheduled once short-term disability paperwork is completed for his work, which requires regular lifting of greater than 30lbs.  Written consent was obtained.  3. Obesity -discussed increased risk of complications including recurrence.  Pt requests to proceed with surgery so he can lose weight.  4. DM -recently changed med regimen.  Continue current meds  5. HTN -known to be measured higher at provider office per patient report.  Continue to monitor and take meds as prescribed.   Of the 35 minutes I spent with the patient, 15 minutes were spent discussing pathophysiology, risks, benefits, alternatives to the treatment options     Electronically signed by Roy Walker, Roy Aloia, DO on 03/14/2018 9:27 AM

## 2018-05-25 MED ORDER — CEFAZOLIN SODIUM-DEXTROSE 2-4 GM/100ML-% IV SOLN
2.0000 g | INTRAVENOUS | Status: AC
  Administered 2018-05-26: 2 g via INTRAVENOUS

## 2018-05-26 ENCOUNTER — Encounter: Payer: Self-pay | Admitting: Anesthesiology

## 2018-05-26 ENCOUNTER — Other Ambulatory Visit: Payer: Self-pay

## 2018-05-26 ENCOUNTER — Ambulatory Visit: Payer: BLUE CROSS/BLUE SHIELD | Admitting: Anesthesiology

## 2018-05-26 ENCOUNTER — Encounter
Admission: RE | Disposition: A | Discharge status: Home / Self Care | Payer: Self-pay | Source: Ambulatory Visit | Attending: Surgery

## 2018-05-26 ENCOUNTER — Ambulatory Visit
Admission: RE | Admit: 2018-05-26 | Discharge: 2018-05-26 | Disposition: A | Discharge status: Home / Self Care | Payer: BLUE CROSS/BLUE SHIELD | Source: Ambulatory Visit | Attending: Surgery | Admitting: Surgery

## 2018-05-26 DIAGNOSIS — Z7984 Long term (current) use of oral hypoglycemic drugs: Secondary | ICD-10-CM | POA: Diagnosis not present

## 2018-05-26 DIAGNOSIS — K420 Umbilical hernia with obstruction, without gangrene: Secondary | ICD-10-CM

## 2018-05-26 DIAGNOSIS — Z6830 Body mass index (BMI) 30.0-30.9, adult: Secondary | ICD-10-CM | POA: Insufficient documentation

## 2018-05-26 DIAGNOSIS — Z87891 Personal history of nicotine dependence: Secondary | ICD-10-CM | POA: Diagnosis not present

## 2018-05-26 DIAGNOSIS — Z7982 Long term (current) use of aspirin: Secondary | ICD-10-CM | POA: Insufficient documentation

## 2018-05-26 DIAGNOSIS — I10 Essential (primary) hypertension: Secondary | ICD-10-CM | POA: Diagnosis not present

## 2018-05-26 DIAGNOSIS — E669 Obesity, unspecified: Secondary | ICD-10-CM | POA: Insufficient documentation

## 2018-05-26 DIAGNOSIS — Z79899 Other long term (current) drug therapy: Secondary | ICD-10-CM | POA: Diagnosis not present

## 2018-05-26 DIAGNOSIS — E119 Type 2 diabetes mellitus without complications: Secondary | ICD-10-CM | POA: Insufficient documentation

## 2018-05-26 DIAGNOSIS — K429 Umbilical hernia without obstruction or gangrene: Secondary | ICD-10-CM | POA: Insufficient documentation

## 2018-05-26 HISTORY — PX: UMBILICAL HERNIA REPAIR: SHX196

## 2018-05-26 LAB — GLUCOSE, CAPILLARY
GLUCOSE-CAPILLARY: 280 mg/dL — AB (ref 70–99)
Glucose-Capillary: 294 mg/dL — ABNORMAL HIGH (ref 70–99)
Glucose-Capillary: 252 mg/dL — ABNORMAL HIGH (ref 70–99)

## 2018-05-26 SURGERY — REPAIR, HERNIA, UMBILICAL, ADULT
Anesthesia: General | Site: Abdomen

## 2018-05-26 MED ORDER — BUPIVACAINE-EPINEPHRINE (PF) 0.5% -1:200000 IJ SOLN
INTRAMUSCULAR | Status: AC
  Filled 2018-05-26: qty 30

## 2018-05-26 MED ORDER — MIDAZOLAM HCL 2 MG/2ML IJ SOLN
INTRAMUSCULAR | Status: AC
  Filled 2018-05-26: qty 2

## 2018-05-26 MED ORDER — ROCURONIUM BROMIDE 50 MG/5ML IV SOLN
INTRAVENOUS | Status: AC
  Filled 2018-05-26: qty 1

## 2018-05-26 MED ORDER — CELECOXIB 200 MG PO CAPS
ORAL_CAPSULE | ORAL | Status: AC
  Filled 2018-05-26: qty 1

## 2018-05-26 MED ORDER — BUPIVACAINE LIPOSOME 1.3 % IJ SUSP
INTRAMUSCULAR | Status: DC | PRN
  Administered 2018-05-26: 20 mL

## 2018-05-26 MED ORDER — CELECOXIB 200 MG PO CAPS
200.0000 mg | ORAL_CAPSULE | ORAL | Status: AC
  Administered 2018-05-26: 200 mg via ORAL

## 2018-05-26 MED ORDER — FENTANYL CITRATE (PF) 100 MCG/2ML IJ SOLN: 25.0000 ug | INTRAMUSCULAR | Status: DC | PRN

## 2018-05-26 MED ORDER — ACETAMINOPHEN 325 MG PO TABS: 650.0000 mg | ORAL_TABLET | Freq: Three times a day (TID) | ORAL | 0 refills | Status: AC | PRN

## 2018-05-26 MED ORDER — ONDANSETRON HCL 4 MG/2ML IJ SOLN: 4.0000 mg | Freq: Once | INTRAMUSCULAR | Status: DC | PRN

## 2018-05-26 MED ORDER — IBUPROFEN 800 MG PO TABS: 800.0000 mg | ORAL_TABLET | Freq: Three times a day (TID) | ORAL | 0 refills | Status: DC | PRN

## 2018-05-26 MED ORDER — DOCUSATE SODIUM 100 MG PO CAPS: 100.0000 mg | ORAL_CAPSULE | Freq: Two times a day (BID) | ORAL | 0 refills | Status: AC | PRN

## 2018-05-26 MED ORDER — CEFAZOLIN SODIUM-DEXTROSE 2-4 GM/100ML-% IV SOLN
INTRAVENOUS | Status: AC
  Filled 2018-05-26: qty 100

## 2018-05-26 MED ORDER — SUCCINYLCHOLINE CHLORIDE 20 MG/ML IJ SOLN
INTRAMUSCULAR | Status: DC | PRN
  Administered 2018-05-26: 80 mg via INTRAVENOUS

## 2018-05-26 MED ORDER — EPHEDRINE SULFATE 50 MG/ML IJ SOLN
INTRAMUSCULAR | Status: DC | PRN
  Administered 2018-05-26: 10 mg via INTRAVENOUS
  Administered 2018-05-26: 5 mg via INTRAVENOUS

## 2018-05-26 MED ORDER — PROPOFOL 10 MG/ML IV BOLUS
INTRAVENOUS | Status: AC
  Filled 2018-05-26: qty 20

## 2018-05-26 MED ORDER — SUCCINYLCHOLINE CHLORIDE 20 MG/ML IJ SOLN
INTRAMUSCULAR | Status: AC
  Filled 2018-05-26: qty 1

## 2018-05-26 MED ORDER — FAMOTIDINE 20 MG PO TABS
20.0000 mg | ORAL_TABLET | Freq: Once | ORAL | Status: AC
  Administered 2018-05-26: 20 mg via ORAL

## 2018-05-26 MED ORDER — GABAPENTIN 300 MG PO CAPS
ORAL_CAPSULE | ORAL | Status: AC
  Filled 2018-05-26: qty 1

## 2018-05-26 MED ORDER — ACETAMINOPHEN 500 MG PO TABS
ORAL_TABLET | ORAL | Status: AC
  Filled 2018-05-26: qty 2

## 2018-05-26 MED ORDER — GLYCOPYRROLATE 0.2 MG/ML IJ SOLN
INTRAMUSCULAR | Status: DC | PRN
  Administered 2018-05-26: .2 mg via INTRAVENOUS

## 2018-05-26 MED ORDER — DEXAMETHASONE SODIUM PHOSPHATE 10 MG/ML IJ SOLN
INTRAMUSCULAR | Status: AC
  Filled 2018-05-26: qty 1

## 2018-05-26 MED ORDER — SUGAMMADEX SODIUM 200 MG/2ML IV SOLN
INTRAVENOUS | Status: DC | PRN
  Administered 2018-05-26: 189.6 mg via INTRAVENOUS
  Administered 2018-05-26 (×2): 50 mg via INTRAVENOUS

## 2018-05-26 MED ORDER — FAMOTIDINE 20 MG PO TABS
ORAL_TABLET | ORAL | Status: AC
  Filled 2018-05-26: qty 1

## 2018-05-26 MED ORDER — SUGAMMADEX SODIUM 200 MG/2ML IV SOLN
INTRAVENOUS | Status: AC
  Filled 2018-05-26: qty 2

## 2018-05-26 MED ORDER — INSULIN ASPART 100 UNIT/ML ~~LOC~~ SOLN
SUBCUTANEOUS | Status: AC
  Filled 2018-05-26: qty 1

## 2018-05-26 MED ORDER — BUPIVACAINE LIPOSOME 1.3 % IJ SUSP
INTRAMUSCULAR | Status: AC
  Filled 2018-05-26: qty 20

## 2018-05-26 MED ORDER — ONDANSETRON HCL 4 MG/2ML IJ SOLN
INTRAMUSCULAR | Status: DC | PRN
  Administered 2018-05-26: 4 mg via INTRAVENOUS

## 2018-05-26 MED ORDER — INSULIN ASPART 100 UNIT/ML ~~LOC~~ SOLN
5.0000 [IU] | Freq: Once | SUBCUTANEOUS | Status: AC
  Administered 2018-05-26: 5 [IU] via SUBCUTANEOUS

## 2018-05-26 MED ORDER — FENTANYL CITRATE (PF) 100 MCG/2ML IJ SOLN
INTRAMUSCULAR | Status: DC | PRN
  Administered 2018-05-26: 50 ug via INTRAVENOUS
  Administered 2018-05-26: 25 ug via INTRAVENOUS

## 2018-05-26 MED ORDER — GABAPENTIN 300 MG PO CAPS
300.0000 mg | ORAL_CAPSULE | ORAL | Status: AC
  Administered 2018-05-26: 300 mg via ORAL

## 2018-05-26 MED ORDER — INSULIN REGULAR HUMAN 100 UNIT/ML IJ SOLN: 5.0000 [IU] | Freq: Once | INTRAMUSCULAR | Status: DC

## 2018-05-26 MED ORDER — ROCURONIUM BROMIDE 100 MG/10ML IV SOLN
INTRAVENOUS | Status: DC | PRN
  Administered 2018-05-26: 30 mg via INTRAVENOUS
  Administered 2018-05-26 (×2): 10 mg via INTRAVENOUS

## 2018-05-26 MED ORDER — PHENYLEPHRINE HCL 10 MG/ML IJ SOLN
INTRAMUSCULAR | Status: DC | PRN
  Administered 2018-05-26 (×2): 100 ug via INTRAVENOUS
  Administered 2018-05-26: 50 ug via INTRAVENOUS

## 2018-05-26 MED ORDER — FENTANYL CITRATE (PF) 100 MCG/2ML IJ SOLN
INTRAMUSCULAR | Status: AC
  Filled 2018-05-26: qty 2

## 2018-05-26 MED ORDER — SODIUM CHLORIDE 0.9 % IV SOLN
INTRAVENOUS | Status: DC
  Administered 2018-05-26: 10:00:00 via INTRAVENOUS

## 2018-05-26 MED ORDER — CHLORHEXIDINE GLUCONATE CLOTH 2 % EX PADS: 6.0000 | MEDICATED_PAD | Freq: Once | CUTANEOUS | Status: DC

## 2018-05-26 MED ORDER — ACETAMINOPHEN 500 MG PO TABS
1000.0000 mg | ORAL_TABLET | ORAL | Status: AC
  Administered 2018-05-26: 1000 mg via ORAL

## 2018-05-26 MED ORDER — MIDAZOLAM HCL 2 MG/2ML IJ SOLN
INTRAMUSCULAR | Status: DC | PRN
  Administered 2018-05-26: 1 mg via INTRAVENOUS

## 2018-05-26 MED ORDER — BUPIVACAINE-EPINEPHRINE (PF) 0.5% -1:200000 IJ SOLN
INTRAMUSCULAR | Status: DC | PRN
  Administered 2018-05-26: 5 mL via PERINEURAL
  Administered 2018-05-26: 5.5 mL via PERINEURAL

## 2018-05-26 MED ORDER — HYDROCODONE-ACETAMINOPHEN 5-325 MG PO TABS: 1.0000 | ORAL_TABLET | Freq: Four times a day (QID) | ORAL | 0 refills | Status: AC | PRN

## 2018-05-26 SURGICAL SUPPLY — 32 items
BLADE SURG 15 STRL LF DISP TIS (BLADE) ×1 IMPLANT
BLADE SURG 15 STRL SS (BLADE) ×2
CANISTER SUCT 1200ML W/VALVE (MISCELLANEOUS) ×3 IMPLANT
CHLORAPREP W/TINT 26ML (MISCELLANEOUS) ×3 IMPLANT
DERMABOND ADVANCED (GAUZE/BANDAGES/DRESSINGS) ×2
DERMABOND ADVANCED .7 DNX12 (GAUZE/BANDAGES/DRESSINGS) ×1 IMPLANT
DRAPE LAPAROTOMY 77X122 PED (DRAPES) ×3 IMPLANT
ELECT REM PT RETURN 9FT ADLT (ELECTROSURGICAL) ×3
ELECTRODE REM PT RTRN 9FT ADLT (ELECTROSURGICAL) ×1 IMPLANT
GLOVE BIOGEL PI IND STRL 7.0 (GLOVE) ×1 IMPLANT
GLOVE BIOGEL PI INDICATOR 7.0 (GLOVE) ×2
GLOVE SURG SYN 6.5 ES PF (GLOVE) ×3 IMPLANT
GOWN STRL REUS W/ TWL LRG LVL3 (GOWN DISPOSABLE) ×3 IMPLANT
GOWN STRL REUS W/TWL LRG LVL3 (GOWN DISPOSABLE) ×6
KIT TURNOVER KIT A (KITS) ×3 IMPLANT
LABEL OR SOLS (LABEL) ×3 IMPLANT
MESH VENTRALEX ST 2.5 CRC MED (Mesh General) ×3 IMPLANT
NEEDLE HYPO 22GX1.5 SAFETY (NEEDLE) ×3 IMPLANT
NEEDLE HYPO 25X1 1.5 SAFETY (NEEDLE) ×3 IMPLANT
NS IRRIG 500ML POUR BTL (IV SOLUTION) ×3 IMPLANT
PACK BASIN MINOR ARMC (MISCELLANEOUS) ×3 IMPLANT
SUT ETHIBOND NAB MO 7 #0 18IN (SUTURE) ×6 IMPLANT
SUT MNCRL 4-0 (SUTURE) ×2
SUT MNCRL 4-0 27XMFL (SUTURE) ×1
SUT VIC AB 2-0 SH 27 (SUTURE) ×2
SUT VIC AB 2-0 SH 27XBRD (SUTURE) ×1 IMPLANT
SUT VIC AB 3-0 SH 27 (SUTURE) ×2
SUT VIC AB 3-0 SH 27X BRD (SUTURE) ×1 IMPLANT
SUTURE MNCRL 4-0 27XMF (SUTURE) ×1 IMPLANT
SYR 10ML LL (SYRINGE) ×6 IMPLANT
TOWEL OR 17X26 4PK STRL BLUE (TOWEL DISPOSABLE) ×3 IMPLANT
WATER STERILE IRR 1000ML POUR (IV SOLUTION) IMPLANT

## 2018-05-26 NOTE — Anesthesia Post-op Follow-up Note (Signed)
Anesthesia QCDR form completed.        

## 2018-05-26 NOTE — Transfer of Care (Signed)
Immediate Anesthesia Transfer of Care Note  Patient: Roy Walker  Procedure(s) Performed: HERNIA REPAIR UMBILICAL ADULT (N/A Abdomen)  Patient Location: PACU  Anesthesia Type:General  Level of Consciousness: awake and oriented  Airway & Oxygen Therapy: Patient Spontanous Breathing and Patient connected to face mask oxygen  Post-op Assessment: Report given to RN and Post -op Vital signs reviewed and stable  Post vital signs: Reviewed and stable  Last Vitals:  Vitals Value Taken Time  BP 136/84 05/26/2018 11:45 AM  Temp    Pulse 82 05/26/2018 11:47 AM  Resp 24 05/26/2018 11:47 AM  SpO2 98 % 05/26/2018 11:47 AM  Vitals shown include unvalidated device data.  Last Pain:  Vitals:   05/26/18 0828  TempSrc: Tympanic  PainSc: 0-No pain         Complications: No apparent anesthesia complications

## 2018-05-26 NOTE — Op Note (Signed)
Preoperative diagnosis: umbilical hernia, obstructed Postoperative diagnosis: same  Procedure:  Open umbilical hernia repair with mesh  Anesthesia: LMA  Surgeon: Sung AmabileIsami Willo Yoon  Wound Classification: Clean  Specimen: none  Complications: None  Estimated Blood Loss: minimal  Indications:see HPI  Findings: 1. 2cm x 1.5cm umbilical hernia 4. Tension free repair achieved with mesh and suture 5. Adequate hemostasis  Description of procedure: The patient was brought to the operating room and general anesthesia was induced. A time-out was completed verifying correct patient, procedure, site, positioning, and implant(s) and/or special equipment prior to beginning this procedure. Antibiotics were administered prior to making the incision. SCDs placed. The anterior abdominal wall was prepped and draped in the standard sterile fashion.   An periumbilical incision was made after infusing the preplanned incision with half percent Marcaine.  Dissection carried down to fascia where the umbilical stalk was noted.  The stalk was transected and there was noted to be a 2cm x 1.5cm umbilical hernia.  The hernia sac and preperitoneal fat contents were dissected off the surrounding structures and reduced.  Hemostasis was confirmed prior to reducing the actual contents. Due to size of defect, decision was made to proceed with a mesh repair.    A 6.4cm umbilical mesh was placed within the preperitoneal cavity through the present defect and secured in place on the abdominal wall using interrupted 0 Ethibond sutures x8.  Once the mesh was noted to be laying flat and secured to the abdominal wall the defect itself was primary closed using serial 0 Ethibond in a simple interrupted fashion.  The fascia as well as the skin incision was then infused with 20 mL's of Exparel.  After confirming hemostasis, the umbilical stalk was reattached to the abdominal wall using 2-0 Vicryl and the wound was irrigated and closed in a  multilayer fashion, using 3-0 Vicryl for the deep dermal layer in an interrupted fashion and running 4-0 Monocryl in a subcuticular fashion.  Wound was then dressed with Dermabond.  Patient was then successfully awakened and transferred to PACU in stable condition.  At the end of the procedure sponge and instrument counts were correct

## 2018-05-26 NOTE — Discharge Instructions (Signed)
Open Hernia Repair, Adult, Care After  - tylenol and advil as needed for discomfort.  Please alternate between the two every four hours as needed for pain.   - Use narcotics, if prescribed, only when tylenol and motrin is not enough to control pain. - 325-650mg  every 8hrs to max of 4000mg /24hrs (including the 325mg  in every norco dose) for the tylenol.   - Advil up to 800mg  per dose every 8hrs as needed for pain.   These instructions give you information about caring for yourself after your procedure. Your doctor may also give you more specific instructions. If you have problems or questions, contact your doctor. Follow these instructions at home: Surgical cut (incision) care   Follow instructions from your doctor about how to take care of your surgical cut area. Make sure you: ? Wash your hands with soap and water before you change your bandage (dressing). If you cannot use soap and water, use hand sanitizer. ? Change your bandage as told by your doctor. ? Leave stitches (sutures), skin glue, or skin tape (adhesive) strips in place. They may need to stay in place for 2 weeks or longer. If tape strips get loose and curl up, you may trim the loose edges. Do not remove tape strips completely unless your doctor says it is okay.  Check your surgical cut every day for signs of infection. Check for: ? More redness, swelling, or pain. ? More fluid or blood. ? Warmth. ? Pus or a bad smell. Activity  Do not drive or use heavy machinery while taking prescription pain medicine. Do not drive until your doctor says it is okay.  Until your doctor says it is okay: ? Do not lift anything that is heavier than 10 lb (4.5 kg). ? Do not push/pull anything greater than 30 lbs ? Do not play contact sports.  Return to your normal activities as told by your doctor. Ask your doctor what activities are safe. General instructions  To prevent or treat having a hard time pooping (constipation) while you are  taking prescription pain medicine, your doctor may recommend that you: ? Drink enough fluid to keep your pee (urine) clear or pale yellow. ? Take over-the-counter or prescription medicines. ? Eat foods that are high in fiber, such as fresh fruits and vegetables, whole grains, and beans. ? Limit foods that are high in fat and processed sugars, such as fried and sweet foods.  Take over-the-counter and prescription medicines only as told by your doctor.  Do not take baths, swim, or use a hot tub until your doctor says it is okay.  Keep all follow-up visits as told by your doctor. This is important. Contact a doctor if:  You develop a rash.  You have more redness, swelling, or pain around your surgical cut.  You have more fluid or blood coming from your surgical cut.  Your surgical cut feels warm to the touch.  You have pus or a bad smell coming from your surgical cut.  You have a fever or chills.  You have blood in your poop (stool).  You have not pooped in 2-3 days.  Medicine does not help your pain. Get help right away if:  You have chest pain or you are short of breath.  You feel light-headed.  You feel weak and dizzy (feel faint).  You have very bad pain.  You throw up (vomit) and your pain is worse. This information is not intended to replace advice given to you by  your health care provider. Make sure you discuss any questions you have with your health care provider. Document Released: 09/13/2014 Document Revised: 03/12/2016 Document Reviewed: 02/04/2016 Elsevier Interactive Patient Education  2018 [employer]   AMBULATORY SURGERY  DISCHARGE INSTRUCTIONS   1) The drugs that you were given will stay in your system until tomorrow so for the next 24 hours you should not:  A) Drive an automobile B) Make any legal decisions C) Drink any alcoholic beverage   2) You may resume regular meals tomorrow.  Today it is better to start with liquids and gradually work  up to solid foods.  You may eat anything you prefer, but it is better to start with liquids, then soup and crackers, and gradually work up to solid foods.   3) Please notify your doctor immediately if you have any unusual bleeding, trouble breathing, redness and pain at the surgery site, drainage, fever, or pain not relieved by medication.    4) Additional Instructions:   Please contact your physician with any problems or Same Day Surgery at [phone], Monday through Friday 6 am to 4 pm, or [place] at [facility] Main number at [phone].

## 2018-05-26 NOTE — Interval H&P Note (Signed)
History and Physical Interval Note:  05/26/2018 9:38 AM  Roy RangManuel Walker  has presented today for surgery, with the diagnosis of UMBILICAL HERNIA  The various methods of treatment have been discussed with the patient and family. After consideration of risks, benefits and other options for treatment, the patient has consented to  Procedure(s): HERNIA REPAIR UMBILICAL ADULT (N/A) as a surgical intervention .  The patient's history has been reviewed, patient examined, no change in status, stable for surgery.  I have reviewed the patient's chart and labs.  Questions were answered to the patient's satisfaction.     Kin Galbraith Tonna BoehringerSakai

## 2018-05-26 NOTE — Anesthesia Procedure Notes (Signed)
Procedure Name: Intubation Date/Time: 05/26/2018 10:01 AM Performed by: Henrietta HooverPope, Nicha Hemann, CRNA Pre-anesthesia Checklist: Patient identified, Patient being monitored, Timeout performed, Emergency Drugs available and Suction available Patient Re-evaluated:Patient Re-evaluated prior to induction Oxygen Delivery Method: Circle system utilized Preoxygenation: Pre-oxygenation with 100% oxygen Induction Type: IV induction Ventilation: Mask ventilation without difficulty Laryngoscope Size: 3 and McGraph Grade View: Grade I Tube type: Oral Tube size: 7.5 mm Number of attempts: 1 Airway Equipment and Method: Stylet Placement Confirmation: ETT inserted through vocal cords under direct vision,  positive ETCO2 and breath sounds checked- equal and bilateral Secured at: 21 cm Tube secured with: Tape Dental Injury: Teeth and Oropharynx as per pre-operative assessment

## 2018-05-26 NOTE — Anesthesia Postprocedure Evaluation (Signed)
Anesthesia Post Note  Patient: Roy Walker  Procedure(s) Performed: HERNIA REPAIR UMBILICAL ADULT (N/A Abdomen)  Patient location during evaluation: PACU Anesthesia Type: General Level of consciousness: awake and alert Pain management: pain level controlled Vital Signs Assessment: post-procedure vital signs reviewed and stable Respiratory status: spontaneous breathing, nonlabored ventilation, respiratory function stable and patient connected to nasal cannula oxygen Cardiovascular status: blood pressure returned to baseline and stable Postop Assessment: no apparent nausea or vomiting Anesthetic complications: no     Last Vitals:  Vitals:   05/26/18 1234 05/26/18 1317  BP: (!) 145/84 (!) 138/92  Pulse:  69  Resp:  18  Temp:    SpO2:  97%    Last Pain:  Vitals:   05/26/18 1317  TempSrc:   PainSc: 0-No pain                 Khristopher Kapaun S

## 2018-05-26 NOTE — Anesthesia Preprocedure Evaluation (Addendum)
Anesthesia Evaluation  Patient identified by MRN, date of birth, ID band Patient awake    Reviewed: Allergy & Precautions, NPO status , Patient's Chart, lab work & pertinent test results, reviewed documented beta blocker date and time   Airway Mallampati: III  TM Distance: >3 FB     Dental  (+) Chipped, Missing, Poor Dentition   Pulmonary former smoker,           Cardiovascular hypertension, Pt. on medications      Neuro/Psych    GI/Hepatic   Endo/Other  diabetes, Type 2  Renal/GU      Musculoskeletal   Abdominal   Peds  Hematology   Anesthesia Other Findings Obese. EKG ok. Glu 294 this am. He was at 570 several months ago, but has reduced to the 300 range recently. I think he lives at this level. Will give 5u sq novolog to bring it into the 200-300 range for wound healing.  Reproductive/Obstetrics                            Anesthesia Physical Anesthesia Plan  ASA: III  Anesthesia Plan: General   Post-op Pain Management:    Induction: Intravenous  PONV Risk Score and Plan:   Airway Management Planned: LMA  Additional Equipment:   Intra-op Plan:   Post-operative Plan:   Informed Consent: I have reviewed the patients History and Physical, chart, labs and discussed the procedure including the risks, benefits and alternatives for the proposed anesthesia with the patient or authorized representative who has indicated his/her understanding and acceptance.     Plan Discussed with: CRNA  Anesthesia Plan Comments:         Anesthesia Quick Evaluation

## 2018-05-27 ENCOUNTER — Encounter: Payer: Self-pay | Admitting: Surgery

## 2020-06-23 ENCOUNTER — Other Ambulatory Visit: Payer: Self-pay

## 2020-06-23 ENCOUNTER — Emergency Department
Admission: EM | Admit: 2020-06-23 | Discharge: 2020-06-23 | Disposition: A | Discharge status: Home / Self Care | Payer: BC Managed Care – PPO | Attending: Emergency Medicine | Admitting: Emergency Medicine

## 2020-06-23 ENCOUNTER — Encounter: Payer: Self-pay | Admitting: Emergency Medicine

## 2020-06-23 DIAGNOSIS — Z5321 Procedure and treatment not carried out due to patient leaving prior to being seen by health care provider: Secondary | ICD-10-CM | POA: Insufficient documentation

## 2020-06-23 DIAGNOSIS — L0211 Cutaneous abscess of neck: Secondary | ICD-10-CM | POA: Insufficient documentation

## 2020-06-23 LAB — CBC WITH DIFFERENTIAL/PLATELET
Abs Immature Granulocytes: 0.06 10*3/uL (ref 0.00–0.07)
Basophils Absolute: 0.1 10*3/uL (ref 0.0–0.1)
Basophils Relative: 1 %
Eosinophils Absolute: 0.3 10*3/uL (ref 0.0–0.5)
Eosinophils Relative: 2 %
HCT: 45.5 % (ref 39.0–52.0)
Hemoglobin: 15.8 g/dL (ref 13.0–17.0)
Immature Granulocytes: 1 %
Lymphocytes Relative: 20 %
Lymphs Abs: 2.6 10*3/uL (ref 0.7–4.0)
MCH: 32.1 pg (ref 26.0–34.0)
MCHC: 34.7 g/dL (ref 30.0–36.0)
MCV: 92.5 fL (ref 80.0–100.0)
Monocytes Absolute: 1.3 10*3/uL — ABNORMAL HIGH (ref 0.1–1.0)
Monocytes Relative: 10 %
Neutro Abs: 8.8 10*3/uL — ABNORMAL HIGH (ref 1.7–7.7)
Neutrophils Relative %: 66 %
Platelets: 235 10*3/uL (ref 150–400)
RBC: 4.92 MIL/uL (ref 4.22–5.81)
RDW: 11.9 % (ref 11.5–15.5)
WBC: 13 10*3/uL — ABNORMAL HIGH (ref 4.0–10.5)
nRBC: 0 % (ref 0.0–0.2)

## 2020-06-23 LAB — COMPREHENSIVE METABOLIC PANEL
ALT: 35 U/L (ref 0–44)
AST: 25 U/L (ref 15–41)
Albumin: 3.9 g/dL (ref 3.5–5.0)
Alkaline Phosphatase: 93 U/L (ref 38–126)
Anion gap: 11 (ref 5–15)
BUN: 12 mg/dL (ref 6–20)
CO2: 24 mmol/L (ref 22–32)
Calcium: 9.1 mg/dL (ref 8.9–10.3)
Chloride: 94 mmol/L — ABNORMAL LOW (ref 98–111)
Creatinine, Ser: 0.72 mg/dL (ref 0.61–1.24)
GFR, Estimated: >60 mL/min (ref >60)
Glucose, Bld: 551 mg/dL (ref 70–99)
Potassium: 4.3 mmol/L (ref 3.5–5.1)
Sodium: 129 mmol/L — ABNORMAL LOW (ref 135–145)
Total Bilirubin: 0.9 mg/dL (ref 0.3–1.2)
Total Protein: 7.3 g/dL (ref 6.5–8.1)

## 2020-06-23 LAB — BETA-HYDROXYBUTYRIC ACID: Beta-Hydroxybutyric Acid: 0.25 mmol/L (ref 0.05–0.27)

## 2020-06-23 NOTE — ED Triage Notes (Signed)
Pt reports abscess to left side of his neck for the past 4-5 days. Pt reports he was scared top drain it.

## 2020-06-23 NOTE — ED Notes (Signed)
Pt called x's, no response 

## 2020-06-23 NOTE — ED Triage Notes (Signed)
Pt called from [facility] to treatment room, no response 

## 2020-06-23 NOTE — ED Triage Notes (Signed)
Pt called from [facility] to treatment room, no response ?

## 2020-06-26 ENCOUNTER — Telehealth: Payer: Self-pay | Admitting: Emergency Medicine

## 2020-06-26 NOTE — Telephone Encounter (Signed)
Called patient due to lwot to inquire about condition and follow up plans. Person who answered says wrong number.

## 2020-09-10 ENCOUNTER — Emergency Department
Admission: EM | Admit: 2020-09-10 | Discharge: 2020-09-10 | Disposition: A | Discharge status: Home / Self Care | Payer: BC Managed Care – PPO | Attending: Emergency Medicine | Admitting: Emergency Medicine

## 2020-09-10 ENCOUNTER — Emergency Department: Payer: BC Managed Care – PPO

## 2020-09-10 ENCOUNTER — Other Ambulatory Visit: Payer: Self-pay

## 2020-09-10 ENCOUNTER — Encounter: Payer: Self-pay | Admitting: Emergency Medicine

## 2020-09-10 DIAGNOSIS — L0211 Cutaneous abscess of neck: Secondary | ICD-10-CM | POA: Diagnosis present

## 2020-09-10 DIAGNOSIS — I10 Essential (primary) hypertension: Secondary | ICD-10-CM | POA: Insufficient documentation

## 2020-09-10 DIAGNOSIS — Z79899 Other long term (current) drug therapy: Secondary | ICD-10-CM | POA: Insufficient documentation

## 2020-09-10 DIAGNOSIS — Z87891 Personal history of nicotine dependence: Secondary | ICD-10-CM | POA: Diagnosis not present

## 2020-09-10 DIAGNOSIS — E119 Type 2 diabetes mellitus without complications: Secondary | ICD-10-CM | POA: Diagnosis not present

## 2020-09-10 DIAGNOSIS — Z7984 Long term (current) use of oral hypoglycemic drugs: Secondary | ICD-10-CM | POA: Insufficient documentation

## 2020-09-10 DIAGNOSIS — L03221 Cellulitis of neck: Secondary | ICD-10-CM | POA: Insufficient documentation

## 2020-09-10 LAB — CBC WITH DIFFERENTIAL/PLATELET
Abs Immature Granulocytes: 0.09 10*3/uL — ABNORMAL HIGH (ref 0.00–0.07)
Basophils Absolute: 0.1 10*3/uL (ref 0.0–0.1)
Basophils Relative: 0 %
Eosinophils Absolute: 0.2 10*3/uL (ref 0.0–0.5)
Eosinophils Relative: 1 %
HCT: 47.6 % (ref 39.0–52.0)
Hemoglobin: 16.3 g/dL (ref 13.0–17.0)
Immature Granulocytes: 1 %
Lymphocytes Relative: 13 %
Lymphs Abs: 2.4 10*3/uL (ref 0.7–4.0)
MCH: 32.1 pg (ref 26.0–34.0)
MCHC: 34.2 g/dL (ref 30.0–36.0)
MCV: 93.7 fL (ref 80.0–100.0)
Monocytes Absolute: 1.8 10*3/uL — ABNORMAL HIGH (ref 0.1–1.0)
Monocytes Relative: 9 %
Neutro Abs: 14.1 10*3/uL — ABNORMAL HIGH (ref 1.7–7.7)
Neutrophils Relative %: 76 %
Platelets: 229 10*3/uL (ref 150–400)
RBC: 5.08 MIL/uL (ref 4.22–5.81)
RDW: 12.2 % (ref 11.5–15.5)
WBC: 18.6 10*3/uL — ABNORMAL HIGH (ref 4.0–10.5)
nRBC: 0 % (ref 0.0–0.2)

## 2020-09-10 LAB — LACTIC ACID, PLASMA: Lactic Acid, Venous: 1 mmol/L (ref 0.5–1.9)

## 2020-09-10 LAB — COMPREHENSIVE METABOLIC PANEL
ALT: 45 U/L — ABNORMAL HIGH (ref 0–44)
AST: 36 U/L (ref 15–41)
Albumin: 3.6 g/dL (ref 3.5–5.0)
Alkaline Phosphatase: 125 U/L (ref 38–126)
Anion gap: 14 (ref 5–15)
BUN: 10 mg/dL (ref 6–20)
CO2: 21 mmol/L — ABNORMAL LOW (ref 22–32)
Calcium: 9.2 mg/dL (ref 8.9–10.3)
Chloride: 98 mmol/L (ref 98–111)
Creatinine, Ser: 0.68 mg/dL (ref 0.61–1.24)
GFR, Estimated: >60 mL/min (ref >60)
Glucose, Bld: 367 mg/dL — ABNORMAL HIGH (ref 70–99)
Potassium: 4.5 mmol/L (ref 3.5–5.1)
Sodium: 133 mmol/L — ABNORMAL LOW (ref 135–145)
Total Bilirubin: 1.7 mg/dL — ABNORMAL HIGH (ref 0.3–1.2)
Total Protein: 7.5 g/dL (ref 6.5–8.1)

## 2020-09-10 IMAGING — CT CT NECK W/ CM
3 of 4 series · 14 of 33 positions shown, 17 images · IV contrast (omnipaque)
Comparison: None.

CLINICAL DATA: Posterior neck abscess.

EXAM:
CT NECK WITH CONTRAST
TECHNIQUE: Multidetector CT imaging of the neck was performed using the
standard protocol following the bolus administration of intravenous
contrast.
CONTRAST:  75mL OMNIPAQUE IOHEXOL 300 MG/ML  SOLN

[Series 5: sag neck · sagittal · 0.52mm/px · 5 of 119 slices shown, 6 images]
[im 40/119  bone]
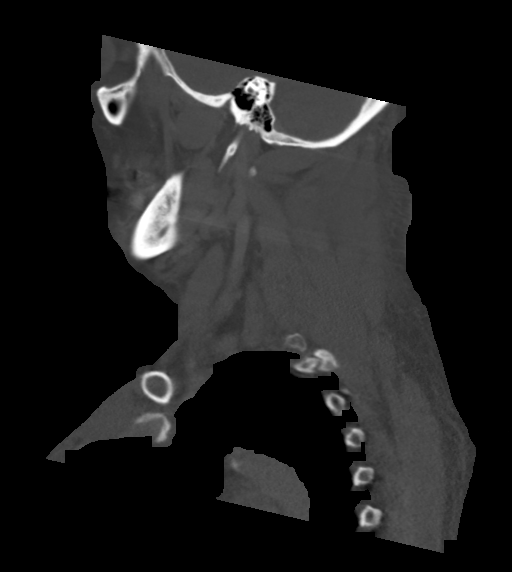
[im 50/119  bone]
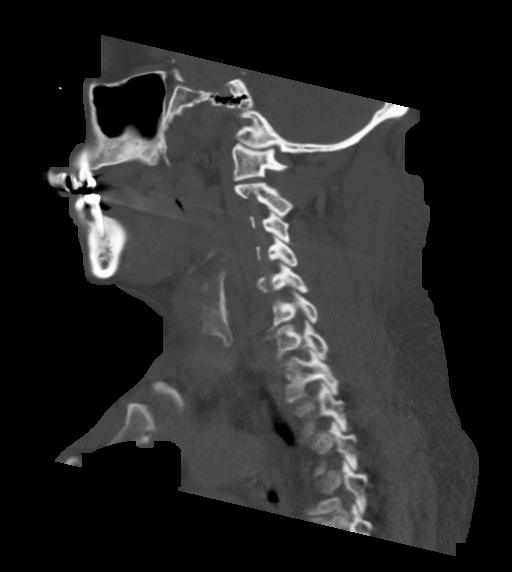
[im 60/119  soft-tissue]
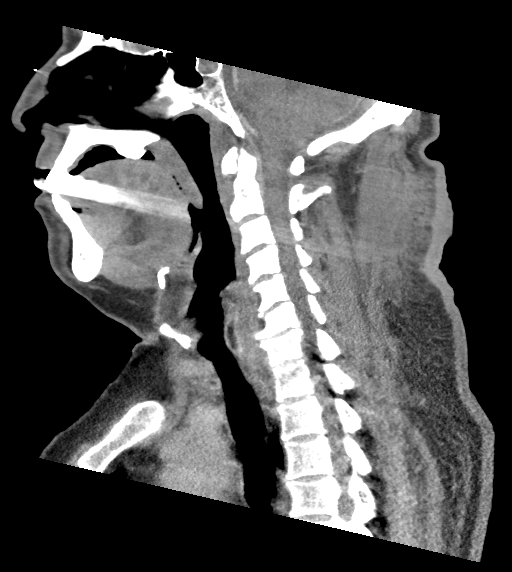
[im 60/119  bone]
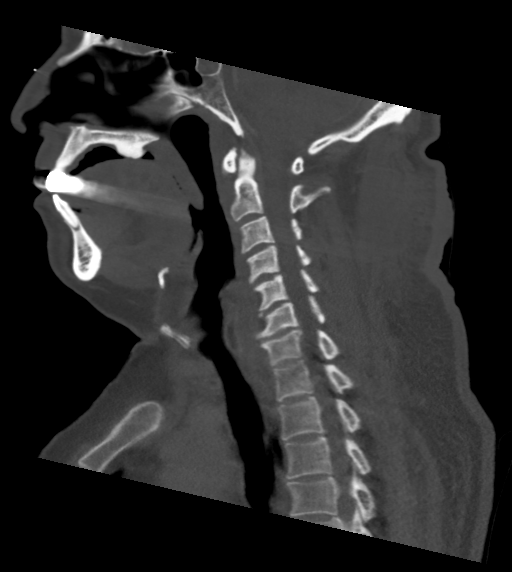
[im 69/119  bone]
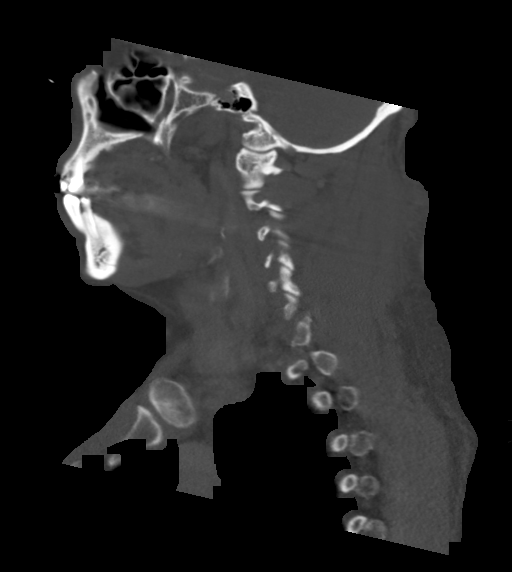
[im 79/119  bone]
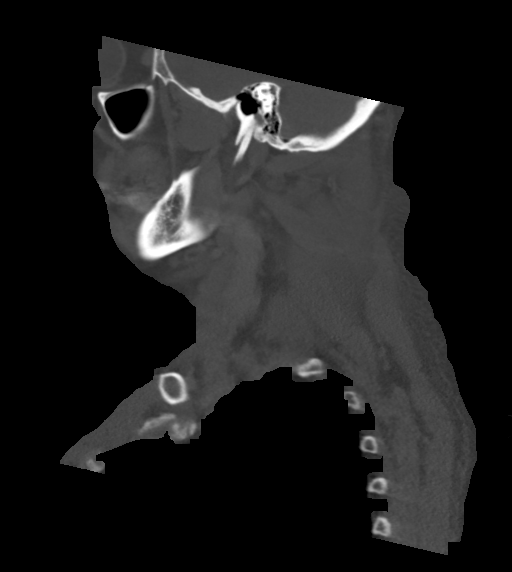

[Series 6: cor neck · coronal · 0.46mm/px · 3 of 128 slices shown]
[im 37/128  bone]
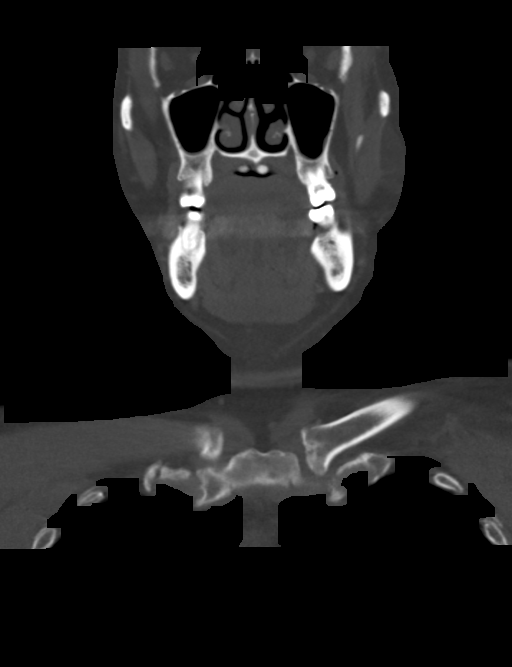
[im 55/128  bone]
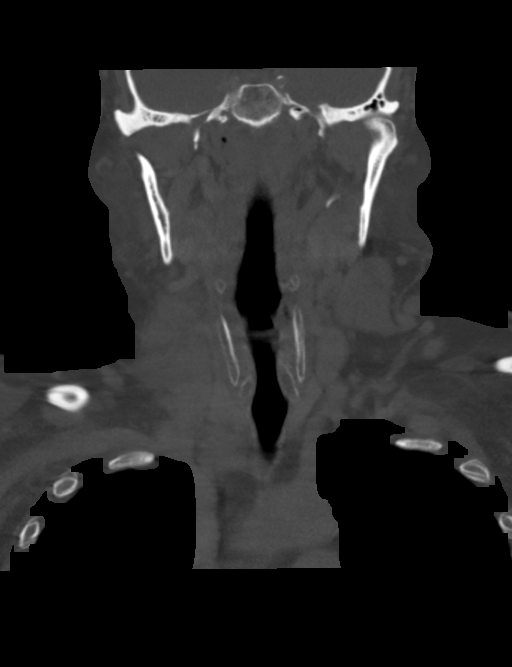
[im 73/128  bone]
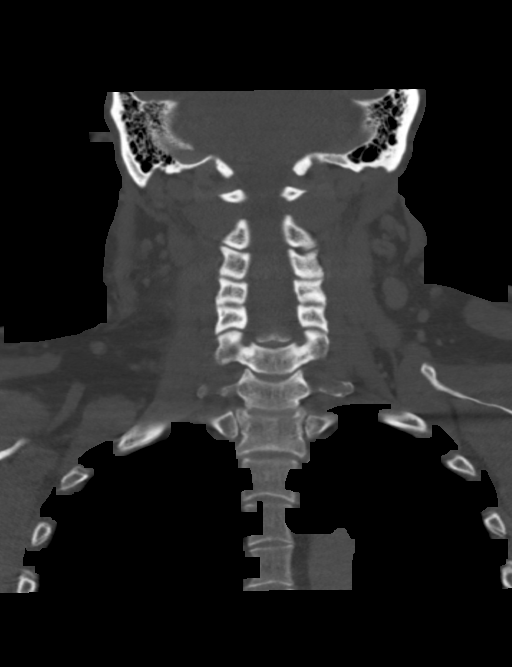

[Series 7: orthogonal ax · axial · 0.46mm/px · z∈[-341,-131]mm · 6 of 153 slices shown, 8 images]
[im 22/153  soft-tissue]
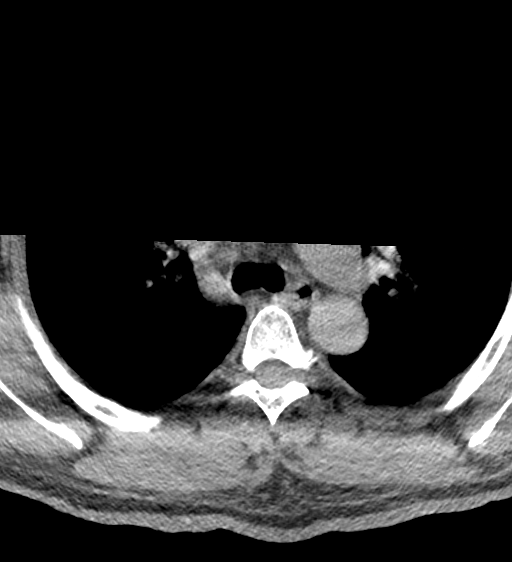
[im 22/153  bone]
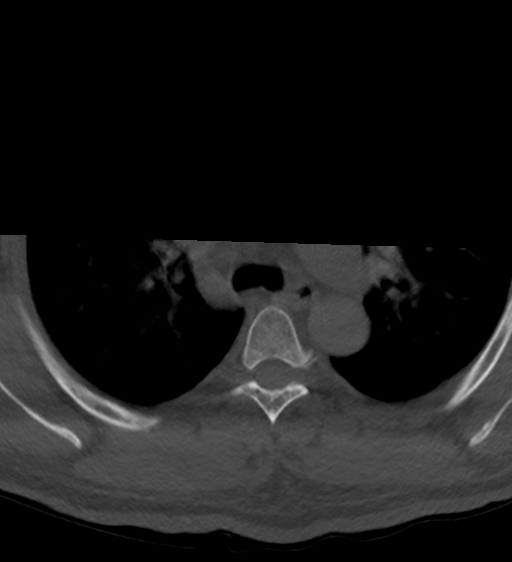
[im 44/153  bone]
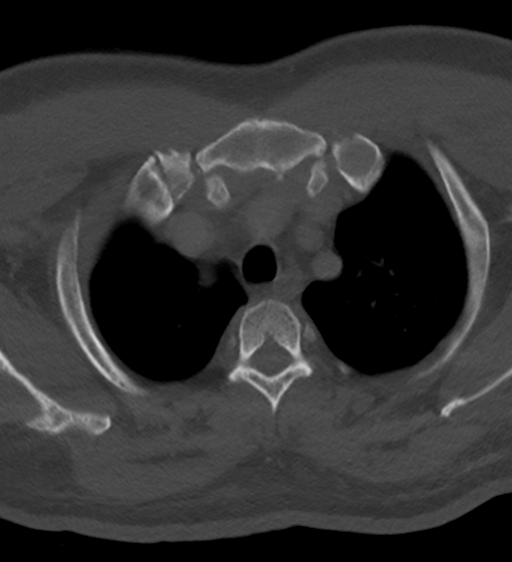
[im 66/153  bone]
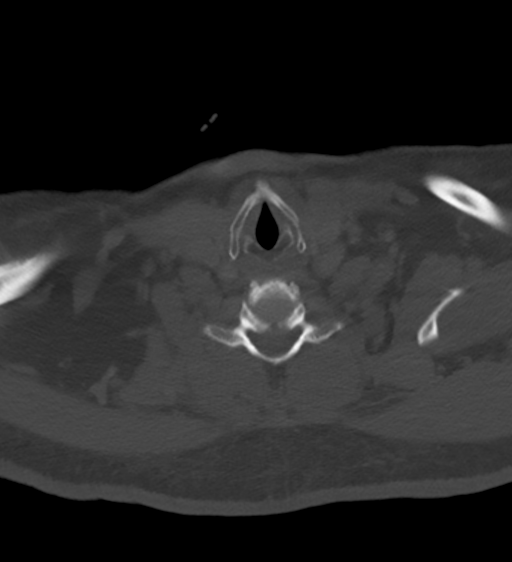
[im 87/153  bone]
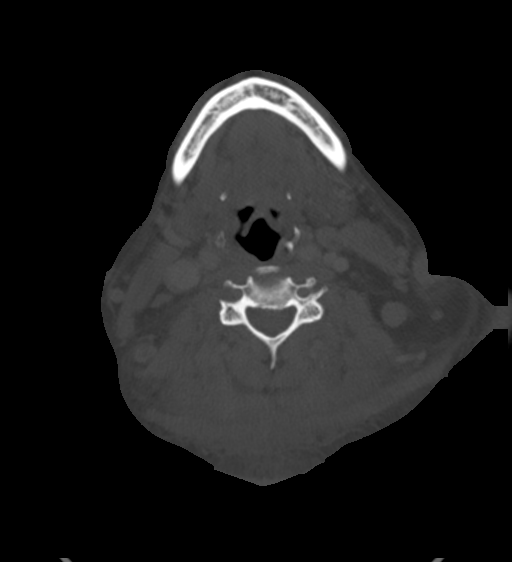
[im 109/153  soft-tissue]
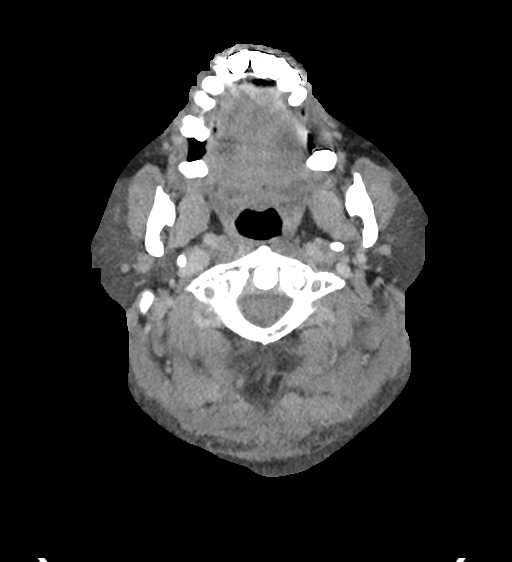
[im 109/153  bone]
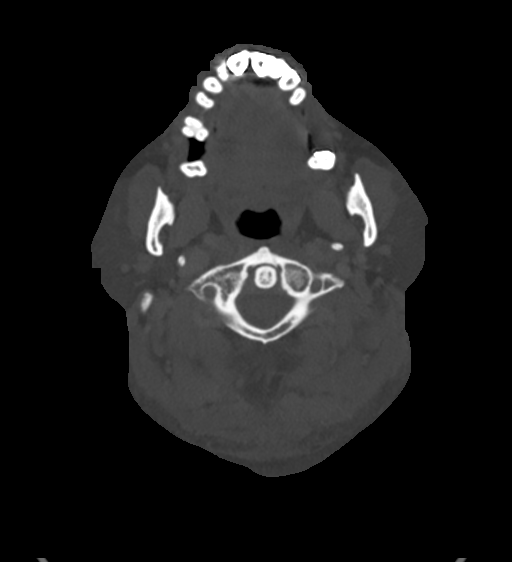
[im 131/153  bone]
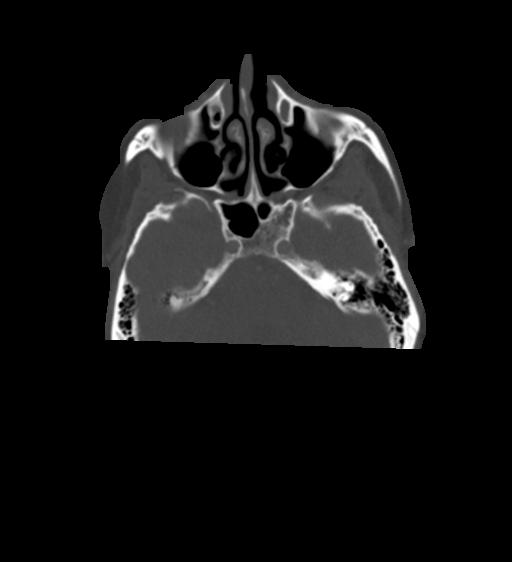

[14 of 33 positions shown; findings below may reference images not displayed]

FINDINGS: Pharynx and larynx: No mucosal or submucosal lesion.

Salivary glands: Parotid and submandibular glands are normal.

Thyroid: Normal

Lymph nodes: No lymphadenopathy.

Vascular: Ordinary calcification of the carotid bifurcations.

Limited intracranial: Normal

Visualized orbits: Normal

Mastoids and visualized paranasal sinuses: Clear

Skeleton: Ordinary cervical spondylosis.

Upper chest: Normal

Other: Soft tissue inflammatory changes of the posterior upper neck,
suggesting regional cellulitis. Skin thickening. Edema of the
subcutaneous fat. Edema within the posterior paraspinous muscles. I
do not see a clear low-density collection to suggest drainable
abscess the inflammatory changes appearing more like cellulitis and
phlegmonous inflammation.

Presumably incidental 1 cm subcutaneous entity in the right neck
inferior to the parotid gland most consistent with a sebaceous cyst.
IMPRESSION: Soft tissue inflammatory changes of the posterior upper neck
suggesting regional cellulitis. Skin thickening. Edema within the
subcutaneous fat. Edema of the posterior paraspinous muscles. No
clear low-density collection to suggest drainable abscess.

## 2020-09-10 MED ORDER — IOHEXOL 300 MG/ML  SOLN
75.0000 mL | Freq: Once | INTRAMUSCULAR | Status: AC | PRN
  Administered 2020-09-10: 75 mL via INTRAVENOUS

## 2020-09-10 MED ORDER — DEXTROSE 5 % IV SOLN
1500.0000 mg | Freq: Once | INTRAVENOUS | Status: AC
  Administered 2020-09-10: 1500 mg via INTRAVENOUS
  Filled 2020-09-10: qty 75

## 2020-09-10 MED ORDER — SODIUM CHLORIDE 0.9 % IV BOLUS
1000.0000 mL | Freq: Once | INTRAVENOUS | Status: AC
  Administered 2020-09-10: 1000 mL via INTRAVENOUS

## 2020-09-10 NOTE — ED Triage Notes (Signed)
Pt comes int the ED via POV c/o neck abscess on the posterior side.  Pt states it has been there x 1 week.  Pt denies that it has drained, but there is significant redness surrounding the area.  Pt in NAD at this time.

## 2020-09-10 NOTE — Progress Notes (Signed)
Inpatient Diabetes Program Recommendations  AACE/ADA: New Consensus Statement on Inpatient Glycemic Control (2015)  Target Ranges:  Prepandial:   less than 140 mg/dL      Peak postprandial:   less than 180 mg/dL (1-2 hours)      Critically ill patients:  140 - 180 mg/dL   Lab Results  Component Value Date   GLUCAP 252 (H) 05/26/2018    Review of Glycemic Control Results for Roy Walker, Roy Walker (MRN [id number]) as of 09/10/2020 13:04  Ref. Range 09/10/2020 08:17  Glucose Latest Ref Range: 70 - 99 mg/dL 937 (H)   Diabetes history: DM 2 Outpatient Diabetes medications:  Amaryl 4 mg daily, Metformin 500 mg bid Current orders for Inpatient glycemic control:  None  Inpatient Diabetes Program Recommendations:    Please consider adding Novolog moderate tid with meals and HS.  Also please check A1C.  Patient likely will also need basal insulin.  Will follow.   Thanks,  Beryl Meager, RN, BC-ADM Inpatient Diabetes Coordinator Pager [phone] (8a-5p)

## 2020-09-10 NOTE — ED Provider Notes (Signed)
[facility] Emergency Department Provider Note   ____________________________________________   Event Date/Time   First MD Initiated Contact with Patient 09/10/20 1137     (approximate)  I have reviewed the triage vital signs and the nursing notes.   HISTORY  Chief Complaint Abscess    HPI Roy Walker is a 55 y.o. male with past medical history of hypertension and diabetes who presents to the ED complaining of abscess.  Patient reports that for the past week he has noticed redness, swelling, and drainage to the posterior part of his neck.  He states he initially dealt with what seemed to be an abscess there a few months ago, after he nicked himself while shaving the back of his head.  That infection seemed to resolve on its own but worsening pain and swelling occurred about 1 week ago.  He is started to notice drainage of pus from the back of his neck.  He denies any fevers, nausea, vomiting, or neck stiffness.  He denies any IV drug use.        Past Medical History:  Diagnosis Date  . Diabetes mellitus without complication ([place])   . Hypertension     There are no problems to display for this patient.   Past Surgical History:  Procedure Laterality Date  . HERNIA REPAIR    . UMBILICAL HERNIA REPAIR N/A 05/26/2018   Procedure: HERNIA REPAIR UMBILICAL ADULT;  Surgeon: Benjamine Sprague, DO;  Location: [facility] ORS;  Service: General;  Laterality: N/A;    Prior to Admission medications   Medication Sig Start Date End Date Taking? Authorizing Provider  blood glucose meter kit and supplies KIT Dispense based on patient and insurance preference. Check blood sugar three times daily before meals. 10/19/16   [name], MD  fenofibrate (TRICOR) 48 MG tablet Take 48 mg by mouth daily.    [provider]  glimepiride (AMARYL) 4 MG tablet Take 4 mg by mouth daily with breakfast.    [provider]  ibuprofen (ADVIL,MOTRIN) 800 MG  tablet Take 1 tablet (800 mg total) by mouth every 8 (eight) hours as needed for mild pain or moderate pain. 05/26/18   Lysle Pearl, Isami, DO  losartan (COZAAR) 50 MG tablet Take 50 mg by mouth daily.    [provider]  metFORMIN (GLUCOPHAGE) 500 MG tablet Take 1 tablet (500 mg total) by mouth 2 (two) times daily with a meal. 10/19/16   [name], MD    Allergies Patient has no known allergies.  History reviewed. No pertinent family history.  Social History Social History   Tobacco Use  . Smoking status: Former Smoker    Types: Cigarettes    Quit date: 10/19/2005    Years since quitting: 14.9  . Smokeless tobacco: Never Used  Substance Use Topics  . Alcohol use: Yes    Alcohol/week: 10.0 standard drinks    Types: 10 Cans of beer per week  . Drug use: No    Review of Systems  Constitutional: No fever/chills Eyes: No visual changes. ENT: No sore throat.  Positive for neck pain and swelling. Cardiovascular: Denies chest pain. Respiratory: Denies shortness of breath. Gastrointestinal: No abdominal pain.  No nausea, no vomiting.  No diarrhea.  No constipation. Genitourinary: Negative for dysuria. Musculoskeletal: Negative for back pain. Skin: Positive for rash. Neurological: Negative for headaches, focal weakness or numbness.  ____________________________________________   PHYSICAL EXAM:  VITAL SIGNS: ED Triage Vitals  Enc Vitals Group     BP  09/10/20 0815 (!) 172/115     Pulse Rate 09/10/20 0815 (!) 102     Resp 09/10/20 0815 18     Temp 09/10/20 0815 100.2 F (37.9 C)     Temp Source 09/10/20 0815 Oral     SpO2 09/10/20 0815 96 %     Weight 09/10/20 0810 198 lb (89.8 kg)     Height 09/10/20 0810 '5\' 7"'  (1.702 m)     Head Circumference --      Peak Flow --      Pain Score 09/10/20 0810 9     Pain Loc --      Pain Edu? --      Excl. in [place]? --     Constitutional: Alert and oriented. Eyes: Conjunctivae are normal. Head: Atraumatic. Nose: No  congestion/rhinnorhea. Mouth/Throat: Mucous membranes are moist. Neck: Normal ROM.  Edema, erythema, and warmth noted to majority of patient's posterior neck with central area of purulent drainage.  No focal fluctuance noted. Cardiovascular: Normal rate, regular rhythm. Grossly normal heart sounds. Respiratory: Normal respiratory effort.  No retractions. Lungs CTAB. Gastrointestinal: Soft and nontender. No distention. Genitourinary: deferred Musculoskeletal: No lower extremity tenderness nor edema. Neurologic:  Normal speech and language. No gross focal neurologic deficits are appreciated. Skin:  Skin is warm, dry and intact. No rash noted. Psychiatric: Mood and affect are normal. Speech and behavior are normal.  ____________________________________________   LABS (all labs ordered are listed, but only abnormal results are displayed)  Labs Reviewed  COMPREHENSIVE METABOLIC PANEL - Abnormal; Notable for the following components:      Result Value   Sodium 133 (*)    CO2 21 (*)    Glucose, Bld 367 (*)    ALT 45 (*)    Total Bilirubin 1.7 (*)    All other components within normal limits  CBC WITH DIFFERENTIAL/PLATELET - Abnormal; Notable for the following components:   WBC 18.6 (*)    Neutro Abs 14.1 (*)    Monocytes Absolute 1.8 (*)    Abs Immature Granulocytes 0.09 (*)    All other components within normal limits  CULTURE, BLOOD (ROUTINE X 2)  CULTURE, BLOOD (ROUTINE X 2)  AEROBIC CULTURE (SUPERFICIAL SPECIMEN)  LACTIC ACID, PLASMA  LACTIC ACID, PLASMA    PROCEDURES  Procedure(s) performed (including Critical Care):  Procedures   ____________________________________________   INITIAL IMPRESSION / ASSESSMENT AND PLAN / ED COURSE       55 year old male with past medical history of hypertension and diabetes who presents to the ED complaining of pain and swelling along with drainage to the posterior portion of his neck for about 1 week.  On exam, he has obvious  cellulitis with possible abscess.  This was further assessed with CT scan, which shows no focal fluid collection concerning for abscess and is more consistent with a cellulitis.  Patient noted to be mildly tachycardic with borderline elevation in temp, however these improved without intervention and he is well-appearing on my assessment.  He does have a leukocytosis but remainder of labs are reassuring, remarkable only for hyperglycemia.  In further discussion with pharmacy, patient would be a candidate for treatment with dalbavancin, which would only require one-time IV dose here in the ED.  Following treatment with this, patient will automatically arrange to have follow-up with infectious disease and will not require further antibiotic treatment.  We will reassess following administration of antibiotics.  Patient feeling well following antibiotic administration, vital signs remain reassuring at this time.  Follow-up arranged with infectious disease for 6 days from now and patient was counseled to return to the ED for any new or worsening symptoms prior to then.  Patient agrees with plan.      ____________________________________________   FINAL CLINICAL IMPRESSION(S) / ED DIAGNOSES  Final diagnoses:  Cellulitis of neck     ED Discharge Orders         Ordered    Ambulatory referral to Infectious Disease       Comments: Cellulitis patient:  Received dalbavancin on 09/10/2020.   09/10/20 1242           Note:  This document was prepared using Dragon voice recognition software and may include unintentional dictation errors.   Blake Divine, MD 09/10/20 [phone]

## 2020-09-12 LAB — AEROBIC CULTURE  (SUPERFICIAL SPECIMEN)

## 2020-09-12 LAB — CULTURE, BLOOD (ROUTINE X 2): Special Requests: ADEQUATE

## 2020-09-13 LAB — AEROBIC CULTURE W GRAM STAIN (SUPERFICIAL SPECIMEN)

## 2020-09-13 LAB — AEROBIC CULTURE  (SUPERFICIAL SPECIMEN)

## 2020-09-14 LAB — CULTURE, BLOOD (ROUTINE X 2)

## 2020-09-15 LAB — CULTURE, BLOOD (ROUTINE X 2)
Culture: NO GROWTH
Culture: NO GROWTH
Special Requests: ADEQUATE

## 2020-09-16 ENCOUNTER — Other Ambulatory Visit: Payer: Self-pay

## 2020-09-16 ENCOUNTER — Ambulatory Visit: Payer: BC Managed Care – PPO | Attending: Infectious Diseases | Admitting: Infectious Diseases

## 2020-09-16 ENCOUNTER — Encounter: Payer: Self-pay | Admitting: Infectious Diseases

## 2020-09-16 VITALS — BP 133/83 | HR 71 | Temp 98.4°F | Resp 16 | Ht 67.0 in | Wt 192.0 lb

## 2020-09-16 DIAGNOSIS — Z79899 Other long term (current) drug therapy: Secondary | ICD-10-CM | POA: Insufficient documentation

## 2020-09-16 DIAGNOSIS — A4901 Methicillin susceptible Staphylococcus aureus infection, unspecified site: Secondary | ICD-10-CM | POA: Diagnosis present

## 2020-09-16 DIAGNOSIS — Z7984 Long term (current) use of oral hypoglycemic drugs: Secondary | ICD-10-CM | POA: Diagnosis not present

## 2020-09-16 DIAGNOSIS — E119 Type 2 diabetes mellitus without complications: Secondary | ICD-10-CM | POA: Diagnosis not present

## 2020-09-16 DIAGNOSIS — L0293 Carbuncle, unspecified: Secondary | ICD-10-CM | POA: Diagnosis not present

## 2020-09-16 DIAGNOSIS — Z87891 Personal history of nicotine dependence: Secondary | ICD-10-CM | POA: Insufficient documentation

## 2020-09-16 DIAGNOSIS — E785 Hyperlipidemia, unspecified: Secondary | ICD-10-CM | POA: Insufficient documentation

## 2020-09-16 DIAGNOSIS — I10 Essential (primary) hypertension: Secondary | ICD-10-CM | POA: Insufficient documentation

## 2020-09-16 DIAGNOSIS — R21 Rash and other nonspecific skin eruption: Secondary | ICD-10-CM | POA: Diagnosis not present

## 2020-09-16 MED ORDER — DICLOXACILLIN SODIUM 500 MG PO CAPS: 500.0000 mg | ORAL_CAPSULE | Freq: Four times a day (QID) | ORAL | 0 refills | Status: DC

## 2020-09-16 NOTE — Progress Notes (Signed)
NAME: Roy Walker  DOB: 03/15/1966  MRN: [id number]  Date/Time: 09/16/2020 10:17 AM   Subjective:   ? Roy Walker is a 55 y.o. male with a history of diabetes mellitus and hypertension is here referred by the ED physician for an abscess on his nape of the neck.  He had been to the ED on 09/10/2020 with with the discharging swelling on his neck which has been present for nearly 2 weeks.  He was also having fever and chills.  In the ED his vitals that day was BP 172/115, heart rate 102, temperature 100.2.  Blood culture was sent.  He was given a dose of dalbavancin as per the cellulitis protocol.  And he is here to see me for the same.  Patient states he has been having spots in his back and neck for the past 6 months.  He has not taken any antibiotics before.  He took Tylenol to get the pain under control.  The one on the mid back healed.  The one on the nape now is been present for the last 2 to 3 weeks. Blood culture has been negative.  The wound culture is positive for staph aureus. Patient says he is feeling better.  The pain is less.  The discharging is little better as well.  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 Past Medical History:  Diagnosis Date  . Diabetes mellitus without complication ([place])   . Hypertension     Past Surgical History:  Procedure Laterality Date  . HERNIA REPAIR    . UMBILICAL HERNIA REPAIR N/A 05/26/2018   Procedure: HERNIA REPAIR UMBILICAL ADULT;  Surgeon: Benjamine Sprague, DO;  Location: [facility] ORS;  Service: General;  Laterality: N/A;    Social History   Socioeconomic History  . Marital status: Married    Spouse name: Not on file  . Number of children: Not on file  . Years of education: Not on file  . Highest education level: Not on file  Occupational History  . Not on file  Tobacco Use  . Smoking status: Former Smoker    Types: Cigarettes    Quit date: 10/19/2005    Years since quitting: 14.9  . Smokeless tobacco:  Never Used  Substance and Sexual Activity  . Alcohol use: Yes    Alcohol/week: 10.0 standard drinks    Types: 10 Cans of beer per week  . Drug use: No  . Sexual activity: Not on file  Other Topics Concern  . Not on file  Social History Narrative  . Not on file   Social Determinants of Health   Financial Resource Strain: Not on file  Food Insecurity: Not on file  Transportation Needs: Not on file  Physical Activity: Not on file  Stress: Not on file  Social Connections: Not on file  Intimate Partner Violence: Not on file    FH Mom HTN DAD -bone cancer  NKDA  ? Current Outpatient Medications  Medication Sig Dispense Refill  . blood glucose meter kit and supplies KIT Dispense based on patient and insurance preference. Check blood sugar three times daily before meals. 1 each 0  . losartan (COZAAR) 50 MG tablet Take 50 mg by mouth daily.    . metFORMIN (GLUCOPHAGE) 500 MG tablet Take 1 tablet (500 mg total) by mouth 2 (two) times daily with a meal. 60 tablet 0   No current facility-administered medications for this visit.     Abtx:  Anti-infectives (From admission, onward)  None      REVIEW OF SYSTEMS:  Const: negative fever, negative chills, negative weight loss Eyes: negative diplopia or visual changes, negative eye pain ENT: negative coryza, negative sore throat Resp: negative cough, hemoptysis, dyspnea Cards: negative for chest pain, palpitations, lower extremity edema GU: negative for frequency, dysuria and hematuria GI: Negative for abdominal pain, diarrhea, bleeding, constipation Skin: As above Heme: negative for easy bruising and gum/nose bleeding MS: negative for myalgias, arthralgias, back pain and muscle weakness Neurolo:negative for headaches, dizziness, vertigo, memory problems  Psych: negative for feelings of anxiety, depression  Endocrine: negative for thyroid, diabetes Allergy/Immunology- negative for any medication or food allergies  Objective:   VITALS:  BP 133/83   Pulse 71   Temp 98.4 F (36.9 C) (Oral)   Resp 16   Ht _0  (1.702 m)   Wt 192 lb (87.1 kg)   SpO2 94%   BMI 30.07 kg/m  PHYSICAL EXAM:  General: Alert, cooperative, no distress, appears stated age.  Head: Normocephalic, without obvious abnormality, atraumatic. Eyes: Conjunctivae clear, anicteric sclerae. Pupils are equal ENT Nares normal. No drainage or sinus tenderness. Lips, mucosa, and tongue normal. No Thrush Neck: Supple, symmetrical, no adenopathy, thyroid: non tender no carotid bruit and no JVD. Nape of neck there is indurated 7 cm lesion which is got multiple opening.     His bone Back: No CVA tenderness. Lungs: Clear to auscultation bilaterally. No Wheezing or Rhonchi. No rales. Heart: Regular rate and rhythm, no murmur, rub or gallop. Abdomen: Soft, non-tender,not distended. Bowel sounds normal. No masses Extremities: atraumatic, no cyanosis. No edema. No clubbing Skin: No rashes or lesions. Or bruising Lymph: Cervical, supraclavicular normal. Neurologic: Grossly non-focal Pertinent Labs Lab Results CBC    Component Value Date/Time   WBC 18.6 (H) 09/10/2020 0817   RBC 5.08 09/10/2020 0817   HGB 16.3 09/10/2020 0817   HCT 47.6 09/10/2020 0817   PLT 229 09/10/2020 0817   MCV 93.7 09/10/2020 0817   MCH 32.1 09/10/2020 0817   MCHC 34.2 09/10/2020 0817   RDW 12.2 09/10/2020 0817   LYMPHSABS 2.4 09/10/2020 0817   MONOABS 1.8 (H) 09/10/2020 0817   EOSABS 0.2 09/10/2020 0817   BASOSABS 0.1 09/10/2020 0817    CMP Latest Ref Rng & Units 09/10/2020 06/23/2020 10/19/2016  Glucose 70 - 99 mg/dL 367(H) 551(HH) 326(H)  BUN 6 - 20 mg/dL _1 Creatinine 0.61 - 1.24 mg/dL 0.68 0.72 0.67  Sodium 135 - 145 mmol/L 133(L) 129(L) 135  Potassium 3.5 - 5.1 mmol/L 4.5 4.3 4.0  Chloride 98 - 111 mmol/L 98 94(L) 99(L)  CO2 22 - 32 mmol/L 21(L) 24 28  Calcium 8.9 - 10.3 mg/dL 9.2 9.1 9.4  Total Protein 6.5 - 8.1 g/dL 7.5 7.3 -  Total Bilirubin  0.3 - 1.2 mg/dL 1.7(H) 0.9 -  Alkaline Phos 38 - 126 U/L 125 93 -  AST 15 - 41 U/L 36 25 -  ALT 0 - 44 U/L 45(H) 35 -      Microbiology: Recent Results (from the past 240 hour(s))  Culture, blood (routine x 2)     Status: None   Collection Time: 09/10/20 12:45 PM   Specimen: BLOOD  Result Value Ref Range Status   Specimen Description BLOOD RIGHT ANTECUBITAL  Final   Special Requests   Final    BOTTLES DRAWN AEROBIC AND ANAEROBIC Blood Culture adequate volume   Culture   Final    NO GROWTH 5 DAYS Performed at  [facility], [address] [place], [place] [address]    Report Status 09/15/2020 FINAL  Final  Culture, blood (routine x 2)     Status: None   Collection Time: 09/10/20 12:45 PM   Specimen: BLOOD  Result Value Ref Range Status   Specimen Description BLOOD BLOOD LEFT HAND  Final   Special Requests   Final    BOTTLES DRAWN AEROBIC AND ANAEROBIC Blood Culture adequate volume   Culture   Final    NO GROWTH 5 DAYS Performed at [facility], [address], [place], [place] [address]    Report Status 09/15/2020 FINAL  Final  Wound or Superficial Culture     Status: None   Collection Time: 09/10/20  2:16 PM   Specimen: Wound  Result Value Ref Range Status   Specimen Description   Final    WOUND Performed at [facility], [address] [place], [place] [address]    Special Requests   Final    NONE Performed at [facility], [address] [place], [place] [address]    Gram Stain   Final    FEW WBC PRESENT, PREDOMINANTLY PMN RARE GRAM POSITIVE COCCI Performed at [facility], [place] [address], [place], [place] [address]    Culture ABUNDANT STAPHYLOCOCCUS AUREUS  Final   Report Status 09/13/2020 FINAL  Final   Organism ID, Bacteria STAPHYLOCOCCUS AUREUS  Final      Susceptibility   Staphylococcus aureus - MIC*    CIPROFLOXACIN <=0.5 SENSITIVE Sensitive     ERYTHROMYCIN <=0.25 SENSITIVE Sensitive     GENTAMICIN  <=0.5 SENSITIVE Sensitive     OXACILLIN 0.5 SENSITIVE Sensitive     TETRACYCLINE <=1 SENSITIVE Sensitive     VANCOMYCIN 1 SENSITIVE Sensitive     TRIMETH/SULFA <=10 SENSITIVE Sensitive     CLINDAMYCIN <=0.25 SENSITIVE Sensitive     RIFAMPIN <=0.5 SENSITIVE Sensitive     Inducible Clindamycin NEGATIVE Sensitive     * ABUNDANT STAPHYLOCOCCUS AUREUS     Impression/Recommendation ? 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 He received a dose of dalbavancin long-acting glycol peptide on 09/10/2020.  We will start him on dicloxacillin 500 mg p.o. every 6 for 2 weeks.  We will refer him to surgeon for possible surgical intervention if needed.  Diabetes mellitus poorly controlled.  He.Marland Kitchen  He is followed by Dr. Ginette Pitman.  As per his note he is supposed to be on Lantus, Amaryl and metformin.  Patient currently only on metformin.  Have asked him to contact his PCP.  Hypertension on losartan  Hyperlipidemia on Crestor but he is not taking that medication now..  Will discuss with Dr.HAnde Will send referral to Dr.Sakai- surgeon Follow up with me in 2 weeks ? ? ___________________________________________________ Discussed with patient Note:  This document was prepared using Dragon voice recognition software and may include unintentional dictation errors.

## 2020-09-16 NOTE — Patient Instructions (Signed)
You are here for an infection on the neck- you have staphylococcus abscess/carbuncle- You need very strict sugar control Today I am prescribing dicloxacillin antibiotic 500mg  taken every 6 hours You need to follow up with Dr.Hande your PCP for sugar I am referring you to see Dr.Sakai surgeon-  Will see you in 2 weeks

## 2020-09-18 ENCOUNTER — Other Ambulatory Visit: Payer: Self-pay

## 2020-09-18 ENCOUNTER — Ambulatory Visit: Payer: BC Managed Care – PPO | Admitting: Infectious Diseases

## 2020-09-18 DIAGNOSIS — A4901 Methicillin susceptible Staphylococcus aureus infection, unspecified site: Secondary | ICD-10-CM

## 2020-09-18 NOTE — Progress Notes (Signed)
amb ref °

## 2020-09-23 ENCOUNTER — Other Ambulatory Visit: Payer: Self-pay | Admitting: Infectious Diseases

## 2020-10-02 ENCOUNTER — Ambulatory Visit: Payer: BC Managed Care – PPO | Admitting: Infectious Diseases

## 2021-12-21 ENCOUNTER — Other Ambulatory Visit: Payer: Self-pay

## 2021-12-21 ENCOUNTER — Encounter: Payer: Self-pay | Admitting: Emergency Medicine

## 2021-12-21 ENCOUNTER — Emergency Department: Payer: BC Managed Care – PPO

## 2021-12-21 DIAGNOSIS — I6601 Occlusion and stenosis of right middle cerebral artery: Secondary | ICD-10-CM | POA: Diagnosis not present

## 2021-12-21 DIAGNOSIS — E1165 Type 2 diabetes mellitus with hyperglycemia: Secondary | ICD-10-CM | POA: Diagnosis not present

## 2021-12-21 DIAGNOSIS — E785 Hyperlipidemia, unspecified: Secondary | ICD-10-CM | POA: Diagnosis not present

## 2021-12-21 DIAGNOSIS — Z79899 Other long term (current) drug therapy: Secondary | ICD-10-CM | POA: Insufficient documentation

## 2021-12-21 DIAGNOSIS — Z87891 Personal history of nicotine dependence: Secondary | ICD-10-CM | POA: Diagnosis not present

## 2021-12-21 DIAGNOSIS — Z7984 Long term (current) use of oral hypoglycemic drugs: Secondary | ICD-10-CM | POA: Insufficient documentation

## 2021-12-21 DIAGNOSIS — H532 Diplopia: Secondary | ICD-10-CM | POA: Insufficient documentation

## 2021-12-21 DIAGNOSIS — I639 Cerebral infarction, unspecified: Secondary | ICD-10-CM | POA: Diagnosis not present

## 2021-12-21 DIAGNOSIS — R42 Dizziness and giddiness: Secondary | ICD-10-CM | POA: Diagnosis present

## 2021-12-21 DIAGNOSIS — I10 Essential (primary) hypertension: Secondary | ICD-10-CM | POA: Diagnosis not present

## 2021-12-21 LAB — CBC WITH DIFFERENTIAL/PLATELET
Abs Immature Granulocytes: 0.03 10*3/uL (ref 0.00–0.07)
Basophils Absolute: 0.1 10*3/uL (ref 0.0–0.1)
Basophils Relative: 1 %
Eosinophils Absolute: 0.4 10*3/uL (ref 0.0–0.5)
Eosinophils Relative: 6 %
HCT: 46.9 % (ref 39.0–52.0)
Hemoglobin: 15.9 g/dL (ref 13.0–17.0)
Immature Granulocytes: 1 %
Lymphocytes Relative: 39 %
Lymphs Abs: 2.5 10*3/uL (ref 0.7–4.0)
MCH: 31.7 pg (ref 26.0–34.0)
MCHC: 33.9 g/dL (ref 30.0–36.0)
MCV: 93.6 fL (ref 80.0–100.0)
Monocytes Absolute: 0.8 10*3/uL (ref 0.1–1.0)
Monocytes Relative: 12 %
Neutro Abs: 2.7 10*3/uL (ref 1.7–7.7)
Neutrophils Relative %: 41 %
Platelets: 207 10*3/uL (ref 150–400)
RBC: 5.01 MIL/uL (ref 4.22–5.81)
RDW: 12.1 % (ref 11.5–15.5)
WBC: 6.5 10*3/uL (ref 4.0–10.5)
nRBC: 0 % (ref 0.0–0.2)

## 2021-12-21 LAB — COMPREHENSIVE METABOLIC PANEL
ALT: 51 U/L — ABNORMAL HIGH (ref 0–44)
AST: 36 U/L (ref 15–41)
Albumin: 3.7 g/dL (ref 3.5–5.0)
Alkaline Phosphatase: 103 U/L (ref 38–126)
Anion gap: 11 (ref 5–15)
BUN: 16 mg/dL (ref 6–20)
CO2: 24 mmol/L (ref 22–32)
Calcium: 9.1 mg/dL (ref 8.9–10.3)
Chloride: 98 mmol/L (ref 98–111)
Creatinine, Ser: 0.65 mg/dL (ref 0.61–1.24)
GFR, Estimated: >60 mL/min (ref >60)
Glucose, Bld: 439 mg/dL — ABNORMAL HIGH (ref 70–99)
Potassium: 4.1 mmol/L (ref 3.5–5.1)
Sodium: 133 mmol/L — ABNORMAL LOW (ref 135–145)
Total Bilirubin: 1 mg/dL (ref 0.3–1.2)
Total Protein: 6.7 g/dL (ref 6.5–8.1)

## 2021-12-21 LAB — PROTIME-INR
INR: 0.9 (ref 0.8–1.2)
Prothrombin Time: 11.6 seconds (ref 11.4–15.2)

## 2021-12-21 LAB — TROPONIN I (HIGH SENSITIVITY): Troponin I (High Sensitivity): 22 ng/L — ABNORMAL HIGH (ref <18)

## 2021-12-21 IMAGING — CT CT HEAD W/O CM
4 series · 16 of 47 positions shown, 18 images · non-contrast
Comparison: None.

CLINICAL DATA: Dizziness



[Series 2: head wo · axial · 0.51mm/px · z∈[-96,+24]mm · 7 of 32 slices shown, 9 images]
[im 4/32  brain]
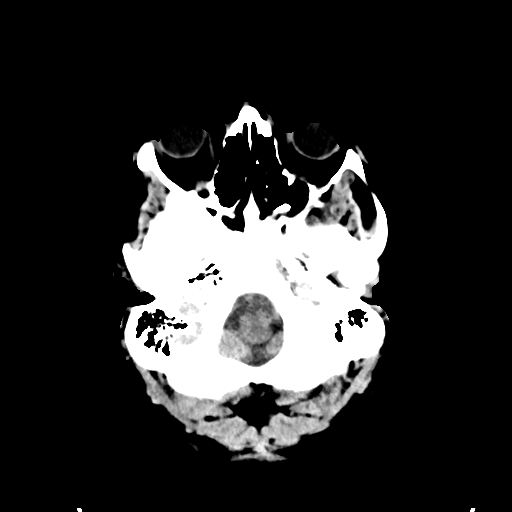
[im 4/32  bone]
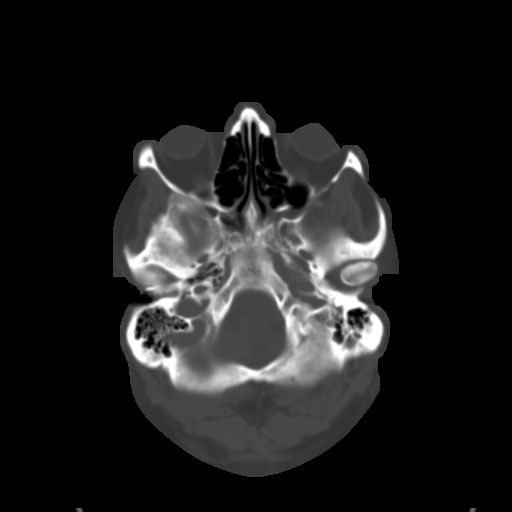
[im 8/32  brain]
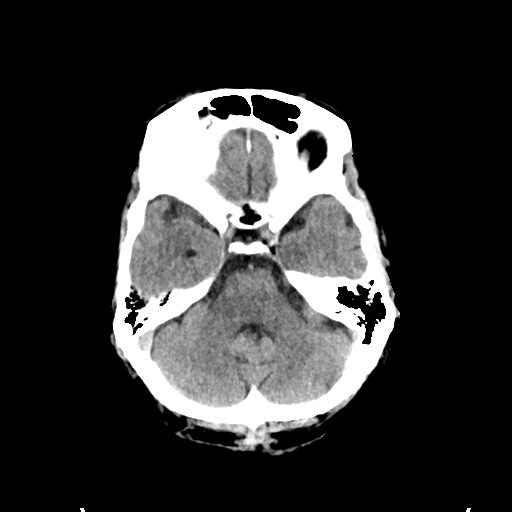
[im 12/32  brain]
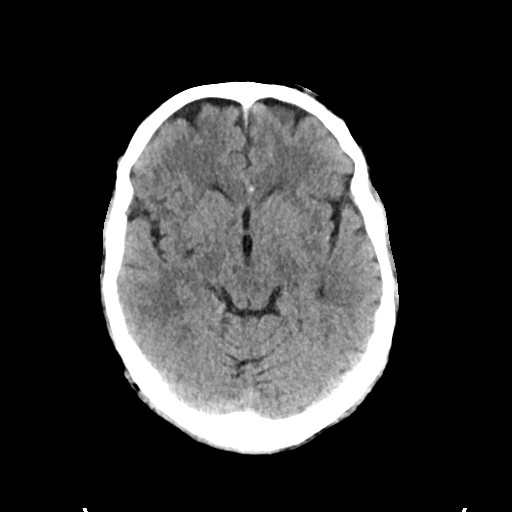
[im 16/32  brain]
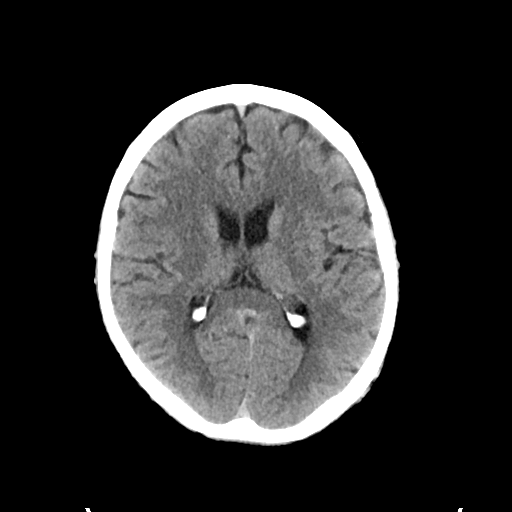
[im 20/32  brain]
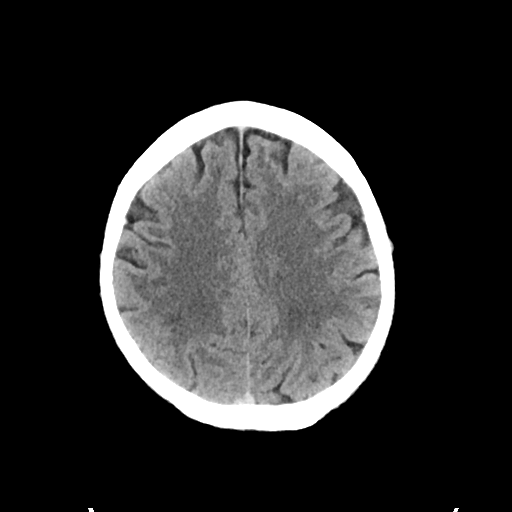
[im 20/32  bone]
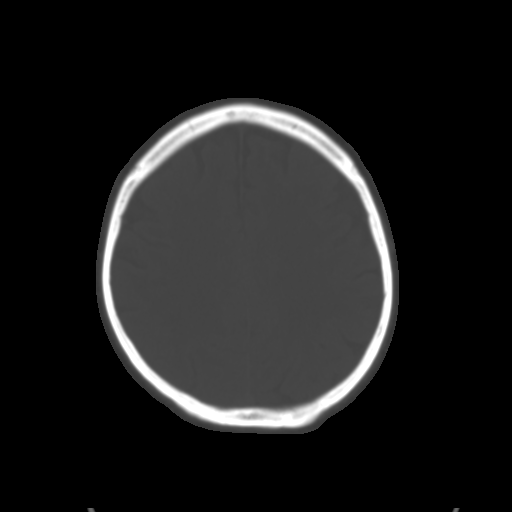
[im 24/32  brain]
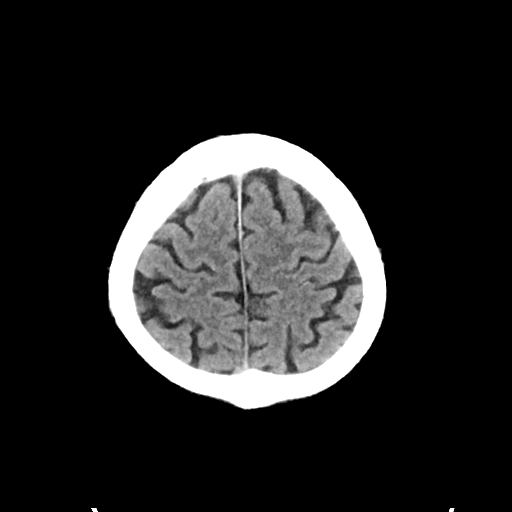
[im 28/32  brain]
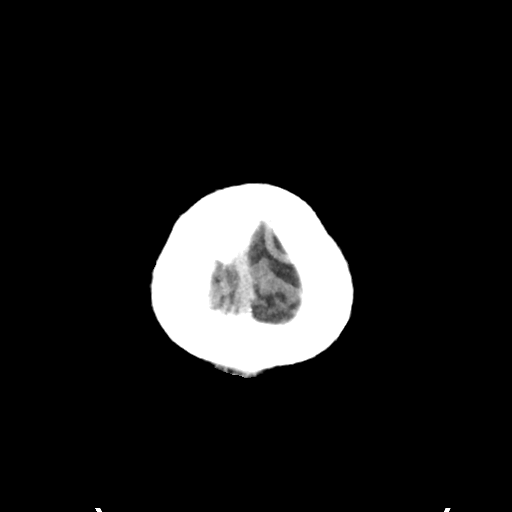

[Series 3: head bone · axial · 0.51mm/px · z∈[-98,-66]mm · 3 of 79 slices shown]
[im 8/79  bone]
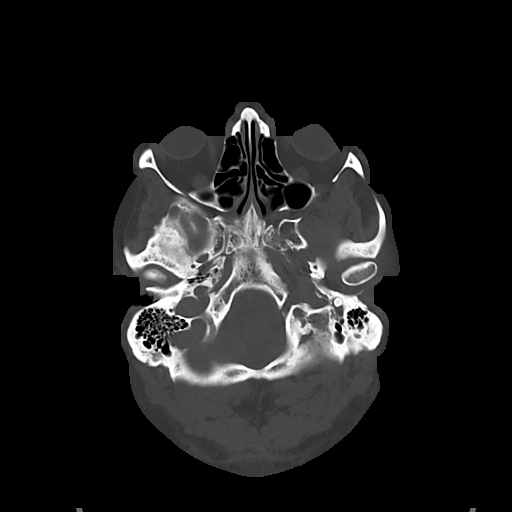
[im 16/79  bone]
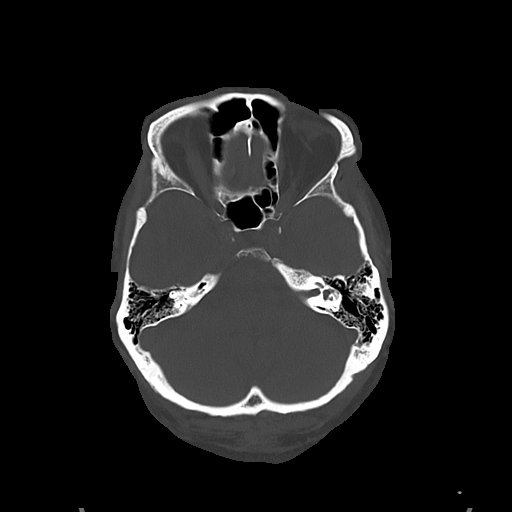
[im 24/79  bone]
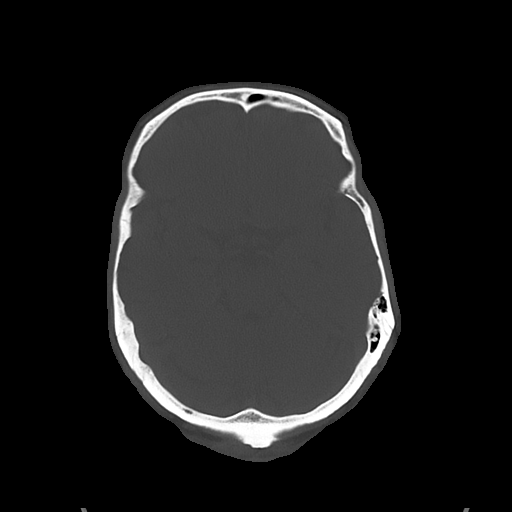

[Series 4: cor soft · coronal · 0.33mm/px · 3 of 67 slices shown]
[im 23/67  brain]
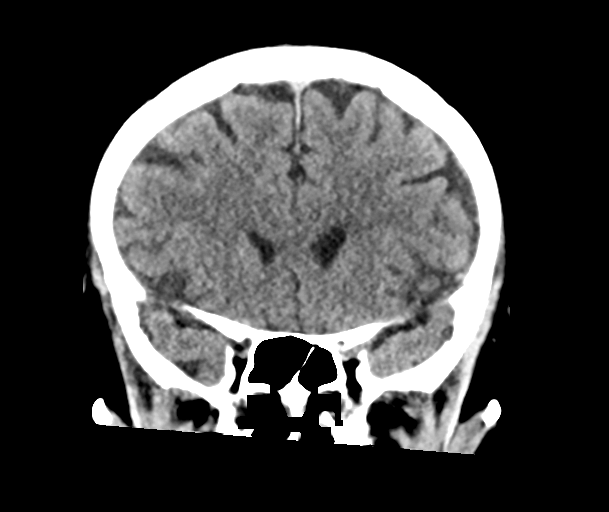
[im 30/67  brain]
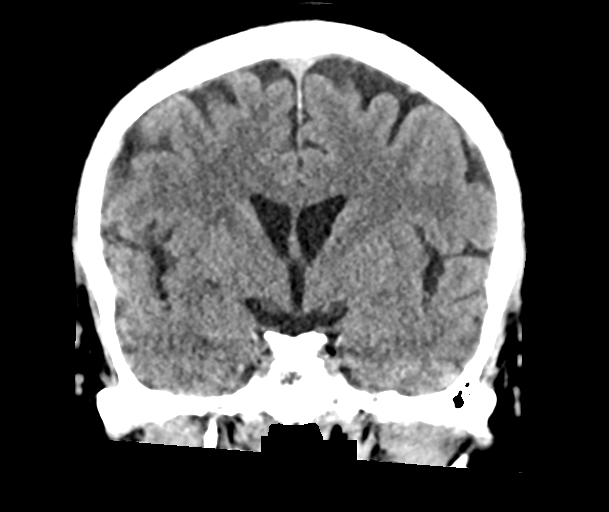
[im 37/67  brain]
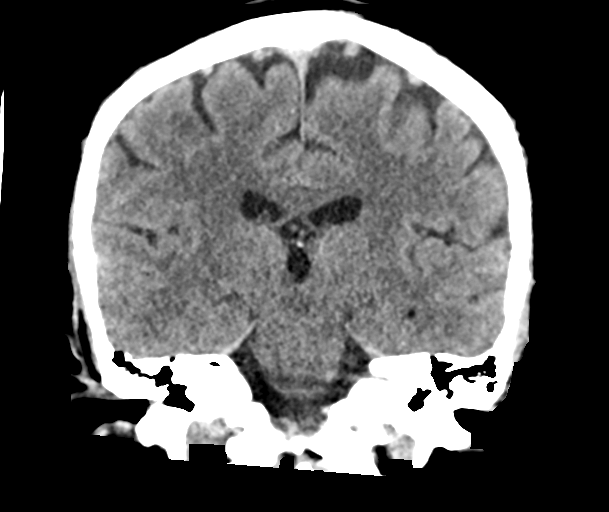

[Series 5: sag soft · sagittal · 0.33mm/px · 3 of 57 slices shown]
[im 19/57  brain]
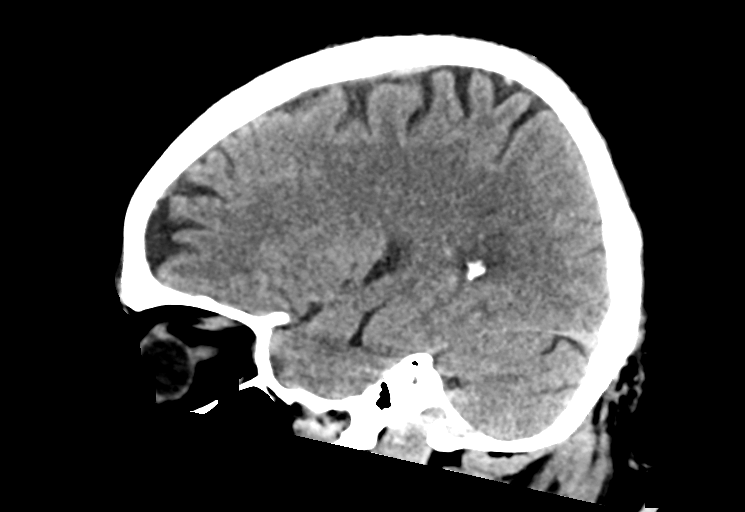
[im 29/57  brain]
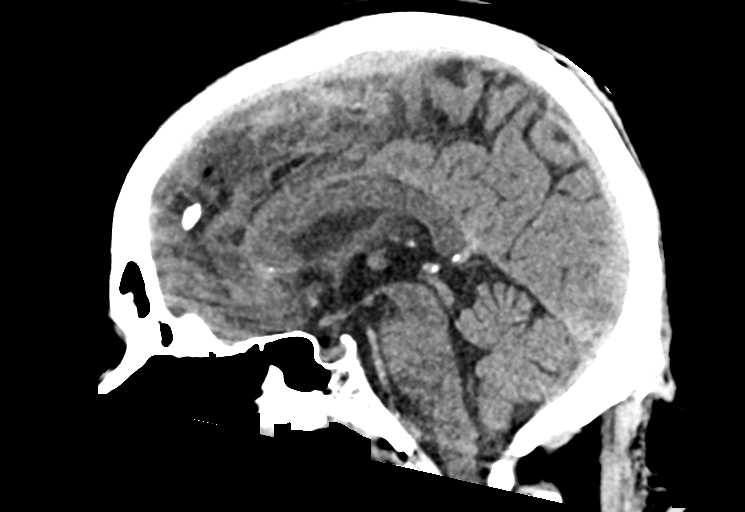
[im 38/57  brain]
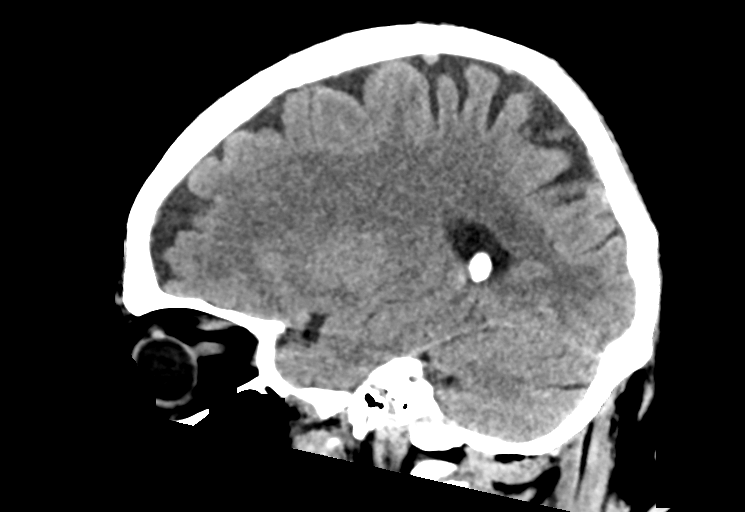

[16 of 47 positions shown; findings below may reference images not displayed]

FINDINGS: Brain: No evidence of acute infarction, hemorrhage, hydrocephalus,
extra-axial collection or mass lesion/mass effect. Scattered
low-density changes within the periventricular and subcortical white
matter compatible with chronic microvascular ischemic change.

Vascular: Atherosclerotic calcifications involving the large vessels
of the skull base. No unexpected hyperdense vessel.

Skull: Normal. Negative for fracture or focal lesion.

Sinuses/Orbits: No acute finding.

Other: None.
IMPRESSION: 1. No acute intracranial abnormality.
2. Mild chronic microvascular ischemic change.

## 2021-12-21 NOTE — ED Provider Triage Note (Signed)
Emergency Medicine Provider Triage Evaluation Note ? ?Roy Walker , a 56 y.o. male  was evaluated in triage.  Pt complains of dizziness and double vision.  Patient had a COVID-vaccine and Pneumovax vaccine on Thursday of last week.  Symptoms started yesterday.  No known injury.  No chest pain or shortness of breath. ? ?Review of Systems  ?Positive: Dizziness, double vision ?Negative: Chest pain, shortness of breath ? ?Physical Exam  ?There were no vitals taken for this visit. ?Gen:   Awake, no distress   ?Resp:  Normal effort  ?MSK:   Moves extremities without difficulty  ?Other:  Neurodeficit, patient is seen for fingers when I hold up to ? ?Medical Decision Making  ?Medically screening exam initiated at 6:10 PM.  Appropriate orders placed.  Roy Walker was informed that the remainder of the evaluation will be completed by another provider, this initial triage assessment does not replace that evaluation, and the importance of remaining in the ED until their evaluation is complete. ? ? ?  ?Faythe Ghee, PA-C ?12/21/21 1811 ? ?

## 2021-12-21 NOTE — ED Triage Notes (Signed)
Pt via POV from home. Pt c/o dizziness and L sided drift, pt states he feels like he is falling to the L side. Denies any weakness. Denies any numbness. Denies hx of stroke. Pt is A&Ox4 and NAD ?

## 2021-12-22 ENCOUNTER — Emergency Department: Payer: BC Managed Care – PPO

## 2021-12-22 ENCOUNTER — Observation Stay (HOSPITAL_COMMUNITY): Payer: BC Managed Care – PPO

## 2021-12-22 ENCOUNTER — Observation Stay: Payer: BC Managed Care – PPO

## 2021-12-22 ENCOUNTER — Observation Stay
Admission: EM | Admit: 2021-12-22 | Discharge: 2021-12-23 | Disposition: A | Discharge status: Home / Self Care | Payer: BC Managed Care – PPO | Attending: Internal Medicine | Admitting: Internal Medicine

## 2021-12-22 ENCOUNTER — Observation Stay (HOSPITAL_BASED_OUTPATIENT_CLINIC_OR_DEPARTMENT_OTHER)
Admit: 2021-12-22 | Discharge: 2021-12-22 | Disposition: A | Discharge status: Home / Self Care | Payer: BC Managed Care – PPO | Attending: Internal Medicine | Admitting: Internal Medicine

## 2021-12-22 DIAGNOSIS — R739 Hyperglycemia, unspecified: Secondary | ICD-10-CM

## 2021-12-22 DIAGNOSIS — I6389 Other cerebral infarction: Secondary | ICD-10-CM | POA: Diagnosis not present

## 2021-12-22 DIAGNOSIS — E1165 Type 2 diabetes mellitus with hyperglycemia: Secondary | ICD-10-CM

## 2021-12-22 DIAGNOSIS — I10 Essential (primary) hypertension: Secondary | ICD-10-CM

## 2021-12-22 DIAGNOSIS — I639 Cerebral infarction, unspecified: Secondary | ICD-10-CM | POA: Diagnosis present

## 2021-12-22 DIAGNOSIS — E119 Type 2 diabetes mellitus without complications: Secondary | ICD-10-CM | POA: Insufficient documentation

## 2021-12-22 DIAGNOSIS — R42 Dizziness and giddiness: Secondary | ICD-10-CM

## 2021-12-22 LAB — ECHOCARDIOGRAM COMPLETE
AR max vel: 2.28 cm2
AV Area VTI: 2.69 cm2
AV Area mean vel: 2.31 cm2
AV Mean grad: 4 mmHg
AV Peak grad: 6.6 mmHg
Ao pk vel: 1.28 m/s
Area-P 1/2: 4.83 cm2
MV VTI: 2.66 cm2
S' Lateral: 2.06 cm

## 2021-12-22 LAB — LIPID PANEL
Cholesterol: 296 mg/dL — ABNORMAL HIGH (ref 0–200)
HDL: 33 mg/dL — ABNORMAL LOW (ref >40)
LDL Cholesterol: UNDETERMINED mg/dL (ref 0–99)
Total CHOL/HDL Ratio: 9 RATIO
Triglycerides: 734 mg/dL — ABNORMAL HIGH (ref <150)
VLDL: UNDETERMINED mg/dL (ref 0–40)

## 2021-12-22 LAB — URINALYSIS, ROUTINE W REFLEX MICROSCOPIC
Bacteria, UA: NONE SEEN
Bilirubin Urine: NEGATIVE
Glucose, UA: >=500 mg/dL — AB
Hgb urine dipstick: NEGATIVE
Ketones, ur: NEGATIVE mg/dL
Leukocytes,Ua: NEGATIVE
Nitrite: NEGATIVE
Protein, ur: NEGATIVE mg/dL
Specific Gravity, Urine: 1.029 (ref 1.005–1.030)
Squamous Epithelial / HPF: NONE SEEN (ref 0–5)
WBC, UA: NONE SEEN WBC/hpf (ref 0–5)
pH: 5 (ref 5.0–8.0)

## 2021-12-22 LAB — CBC
HCT: 44.9 % (ref 39.0–52.0)
Hemoglobin: 15 g/dL (ref 13.0–17.0)
MCH: 31.8 pg (ref 26.0–34.0)
MCHC: 33.4 g/dL (ref 30.0–36.0)
MCV: 95.1 fL (ref 80.0–100.0)
Platelets: 197 10*3/uL (ref 150–400)
RBC: 4.72 MIL/uL (ref 4.22–5.81)
RDW: 12.1 % (ref 11.5–15.5)
WBC: 7.1 10*3/uL (ref 4.0–10.5)
nRBC: 0 % (ref 0.0–0.2)

## 2021-12-22 LAB — GLUCOSE, CAPILLARY
Glucose-Capillary: 170 mg/dL — ABNORMAL HIGH (ref 70–99)
Glucose-Capillary: 377 mg/dL — ABNORMAL HIGH (ref 70–99)

## 2021-12-22 LAB — APTT: aPTT: 27 seconds (ref 24–36)

## 2021-12-22 LAB — CBG MONITORING, ED
Glucose-Capillary: 276 mg/dL — ABNORMAL HIGH (ref 70–99)
Glucose-Capillary: 231 mg/dL — ABNORMAL HIGH (ref 70–99)
Glucose-Capillary: 257 mg/dL — ABNORMAL HIGH (ref 70–99)
Glucose-Capillary: 335 mg/dL — ABNORMAL HIGH (ref 70–99)

## 2021-12-22 LAB — HEMOGLOBIN A1C
Hgb A1c MFr Bld: 14 % — ABNORMAL HIGH (ref 4.8–5.6)
Mean Plasma Glucose: 355.1 mg/dL

## 2021-12-22 LAB — LDL CHOLESTEROL, DIRECT: Direct LDL: 139.6 mg/dL — ABNORMAL HIGH (ref 0–99)

## 2021-12-22 LAB — CREATININE, SERUM
Creatinine, Ser: 0.48 mg/dL — ABNORMAL LOW (ref 0.61–1.24)
GFR, Estimated: >60 mL/min (ref >60)

## 2021-12-22 LAB — HIV ANTIBODY (ROUTINE TESTING W REFLEX): HIV Screen 4th Generation wRfx: NONREACTIVE

## 2021-12-22 LAB — TROPONIN I (HIGH SENSITIVITY): Troponin I (High Sensitivity): 18 ng/L — ABNORMAL HIGH (ref <18)

## 2021-12-22 IMAGING — MR MR MRA HEAD W/O CM
1 series · 26 of 48 positions shown · non-contrast
Comparison: MRI [DATE], no prior MRA

CLINICAL DATA: Acute thalamic infarct on MRI, dizziness

EXAM:
MRA HEAD WITHOUT CONTRAST
TECHNIQUE: Angiographic images of the Circle of Willis were acquired using MRA
technique without intravenous contrast.

[Series 5: TOF · axial · 0.5mm · 0.48mm/px · z∈[-91,+6]mm · 26 of 217 slices shown]
[im 1/217]
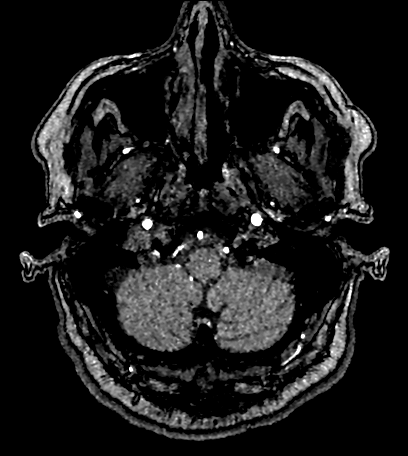
[im 5/217]
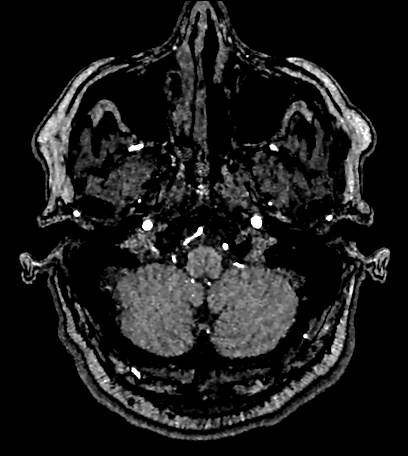
[im 10/217]
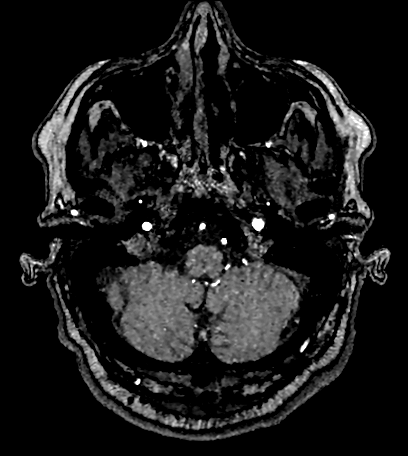
[im 14/217]
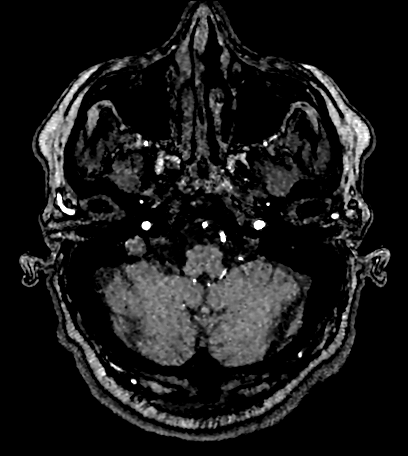
[im 19/217]
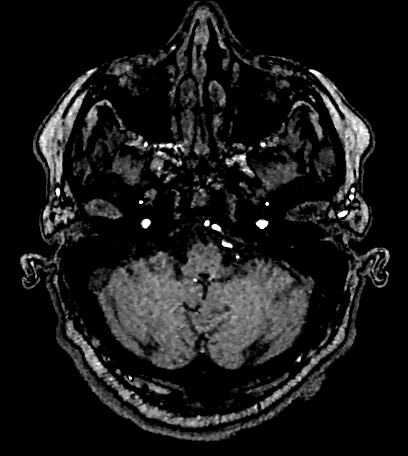
[im 23/217]
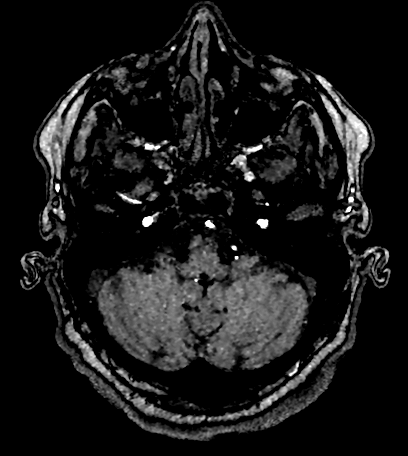
[im 28/217]
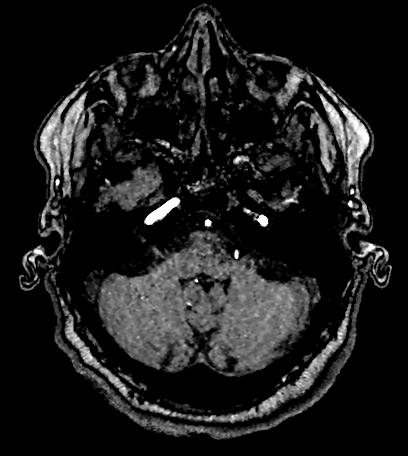
[im 33/217]
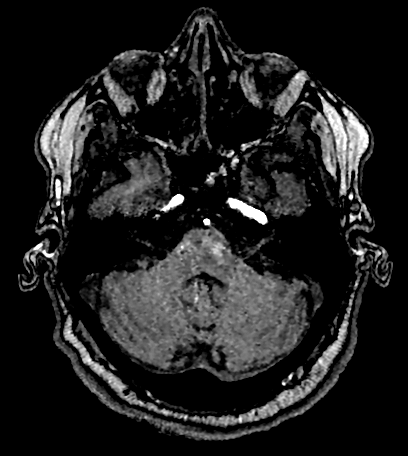
[im 37/217]
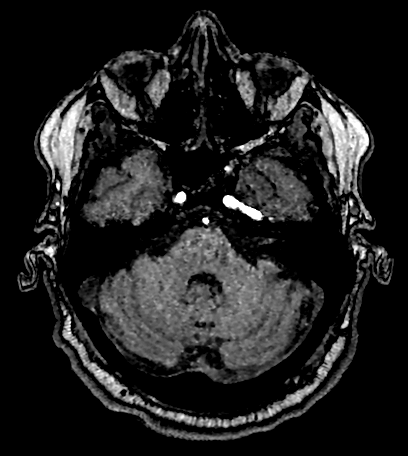
[im 42/217]
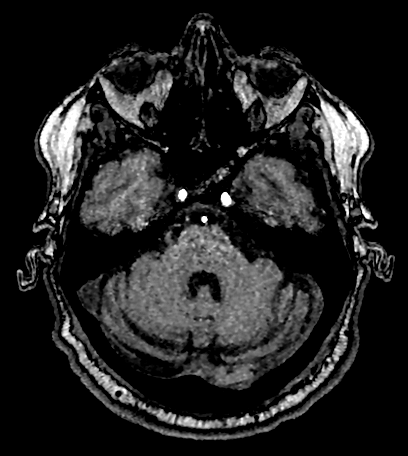
[im 46/217]
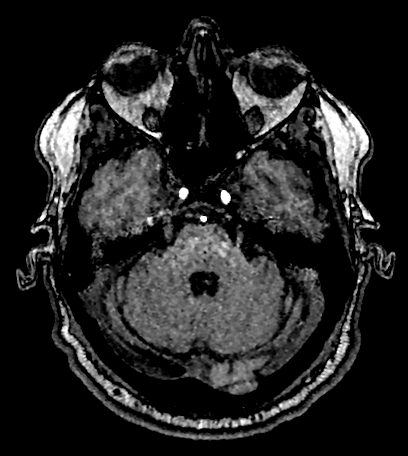
[im 51/217]
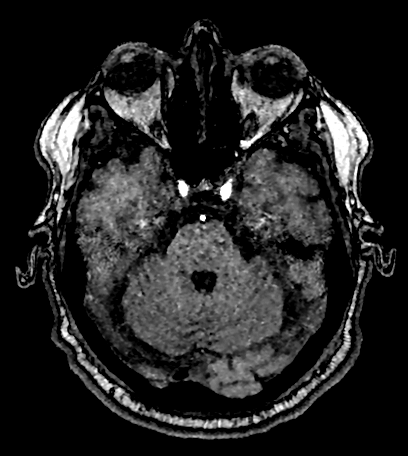
[im 56/217]
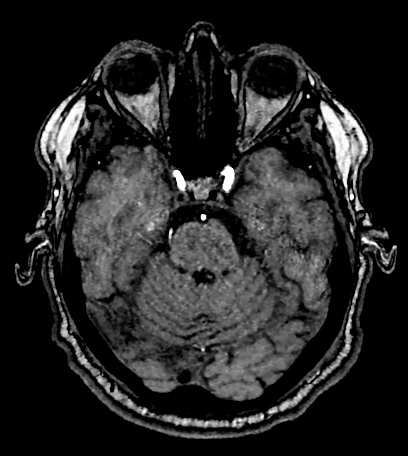
[im 60/217]
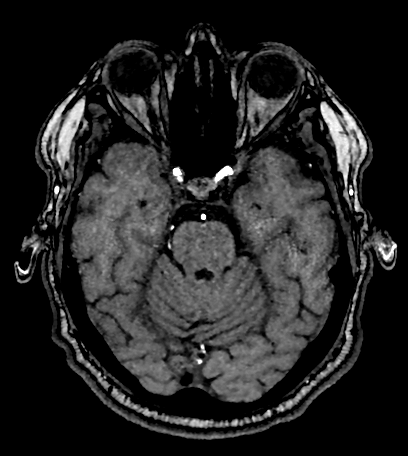
[im 65/217]
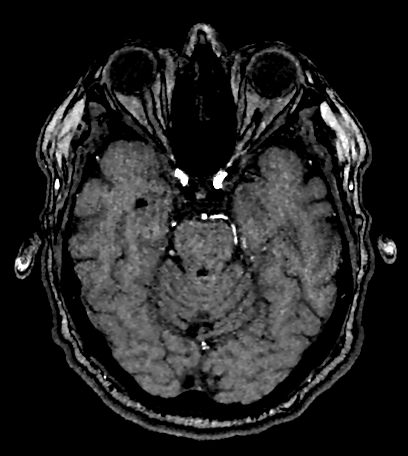
[im 69/217]
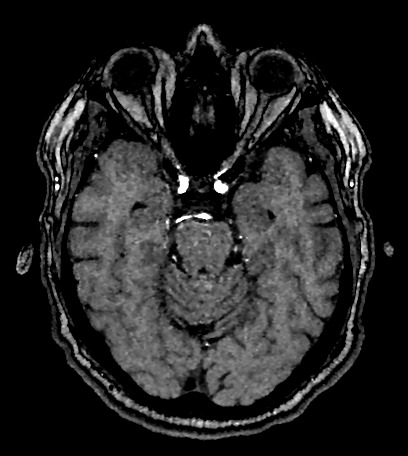
[im 74/217]
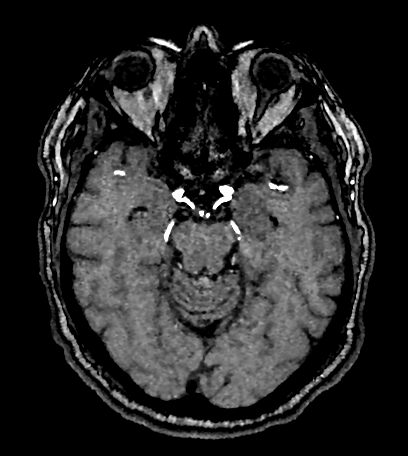
[im 79/217]
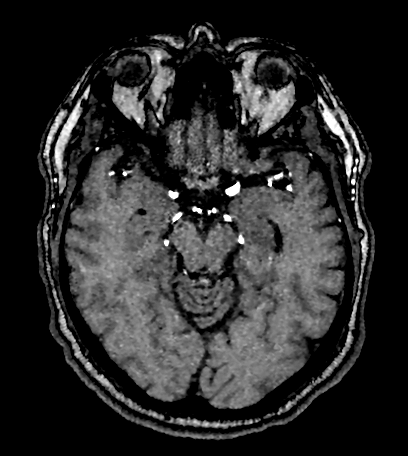
[im 83/217]
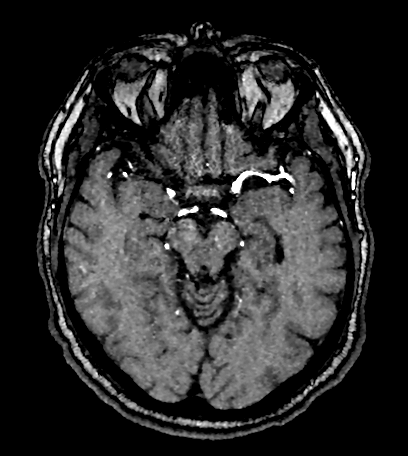
[im 97/217]
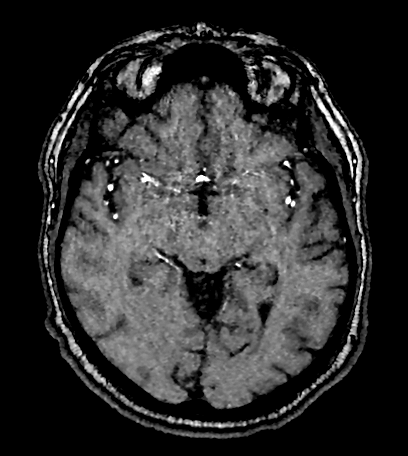
[im 111/217]
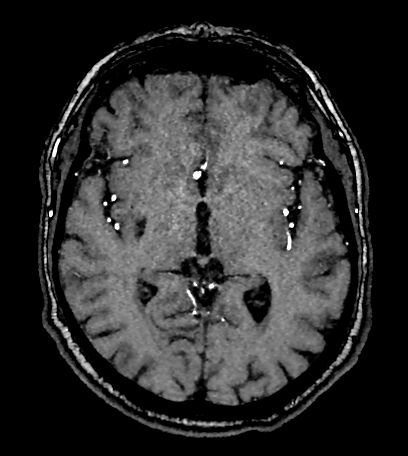
[im 125/217]
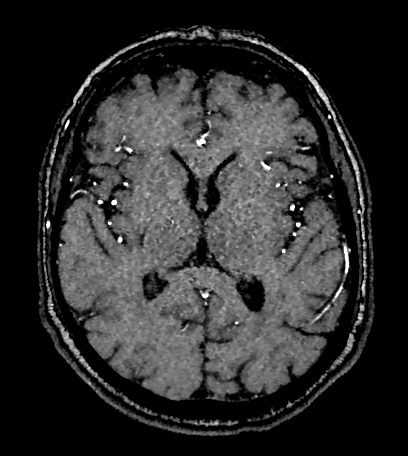
[im 152/217]
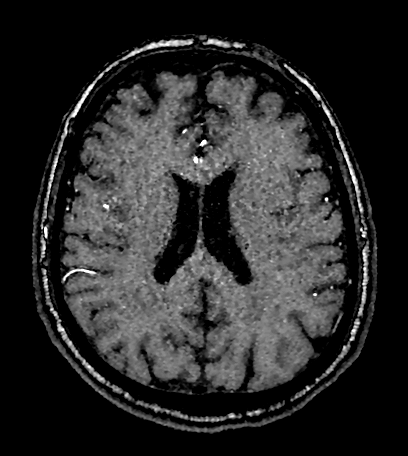
[im 180/217]
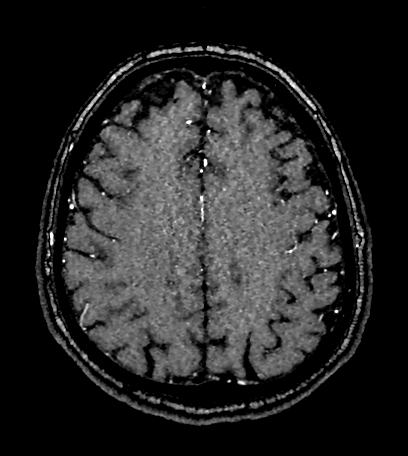
[im 184/217]
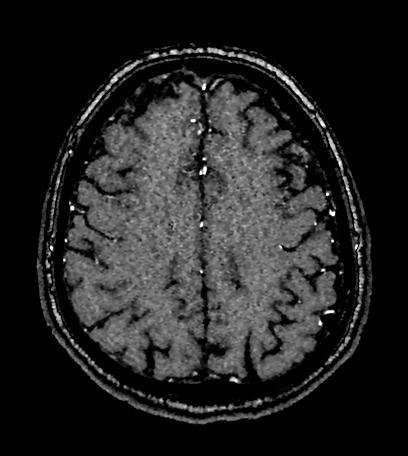
[im 207/217]
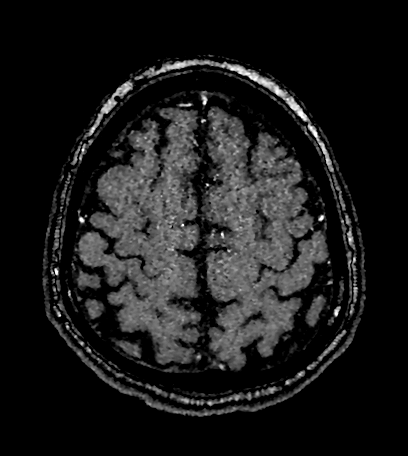

[26 of 48 positions shown; findings below may reference images not displayed]

FINDINGS: Anterior circulation: Both internal carotid arteries are patent to
the termini, without significant stenosis.

A1 segments patent, with mild narrowing of the right A1 versus
hypoplasia. Suspect azygous ACA, with a focal area of severe
stenosis in the A2 segment (series 5, image 100) and abnormal
contour distal to stenosis (series 5, image 99). Perfusion of the
distal ACA branches.

No M1 stenosis or occlusion. Normal MCA bifurcations. Focal
occlusion of both MCA branches just distal to the bifurcation.
(Series 5, image 122), with reconstitution approximately 5 mm
downstream (series 5, image 131) in the M2 branches. Addition there
appears to be severe focal stenosis in the more posterior right M2
branch (series 5, image 130) and poor signal in the more anterior
right M2 branch and its subsequent branches (series 5, image 121,
131 and 139). The distal right MCA branches demonstrate matter
signal. Normal left M2 and more distal MCA branches.

Posterior circulation:

Vertebral arteries patent to the vertebrobasilar junction without
stenosis. Posterior inferior cerebral arteries patent bilaterally.

Basilar patent to its distal aspect. Superior cerebellar arteries
patent bilaterally.

Patent P1 segments with patent posterior communicating arteries
bilaterally. PCAs perfused to their distal aspects without stenosis.

Anatomic variants: None significant

Other: None.
IMPRESSION: 1. Focal occlusion of both M2 branches just distal to the
bifurcation, with reconstitution approximately 5 mm stone stream in
the M2 branches, although there appears to be additional severe
focal stenosis in the more posterior right M2 branch and poor signal
in the more anterior right M2 and M3 branches.
2. Irregularity of the A2 segment, which may be azygous, with a
focal area of severe stenosis in the A2 segment. Subsequent contour
abnormality in the ACA may represent post stenotic dilatation. This
could be further evaluated with a CTA if clinically indicated.

These results will be called to the ordering clinician or
representative by the Radiologist Assistant, and communication
documented in the PACS or [REDACTED].

## 2021-12-22 IMAGING — US US CAROTID DUPLEX BILAT
1 series · 13 of 24 positions shown · non-contrast
Comparison: Soft tissue neck CT with contrast dated [DATE]

CLINICAL DATA: Diabetes and hypertension.  [IA].

EXAM:
BILATERAL CAROTID DUPLEX ULTRASOUND
TECHNIQUE: Gray scale imaging, color Doppler and duplex ultrasound were
performed of bilateral carotid and vertebral arteries in the neck.

[Series 1: us carotid bilateral · 13 of 64 slices shown]
[im 1/64]
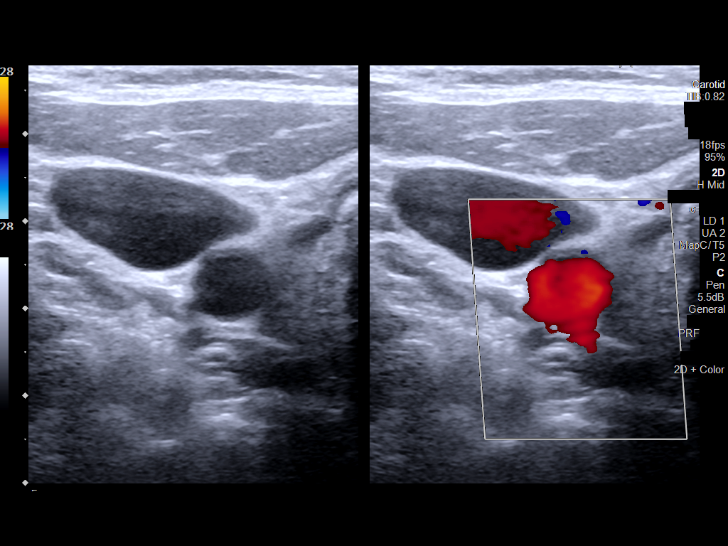
[im 6/64]
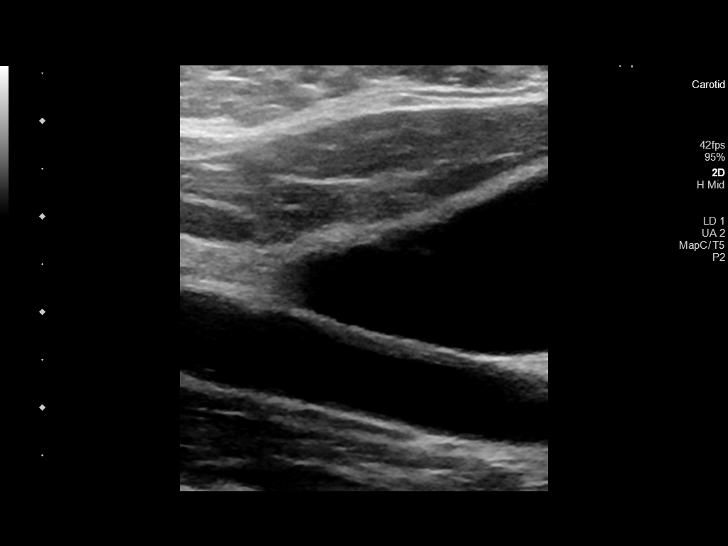
[im 11/64]
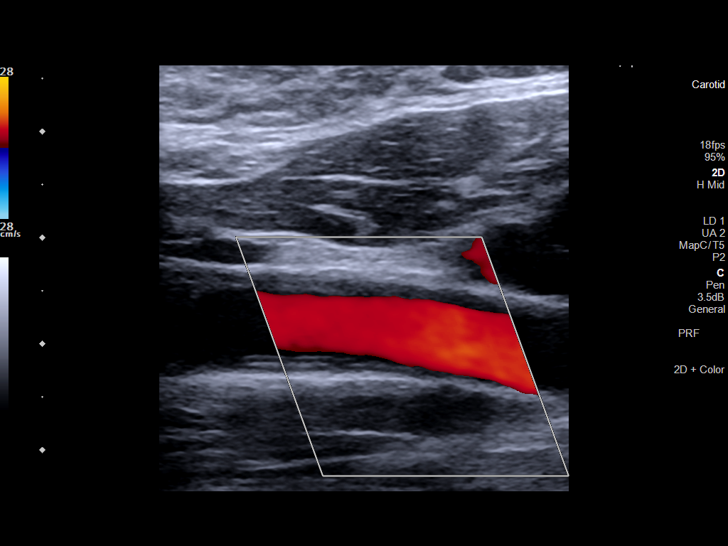
[im 17/64]
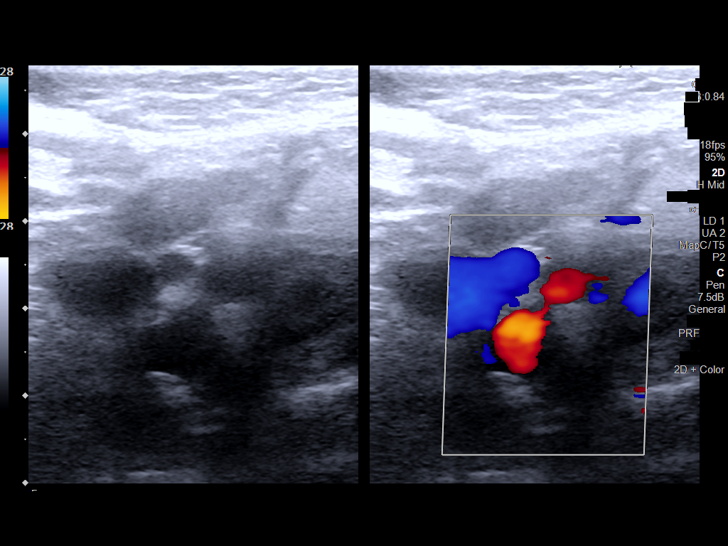
[im 22/64]
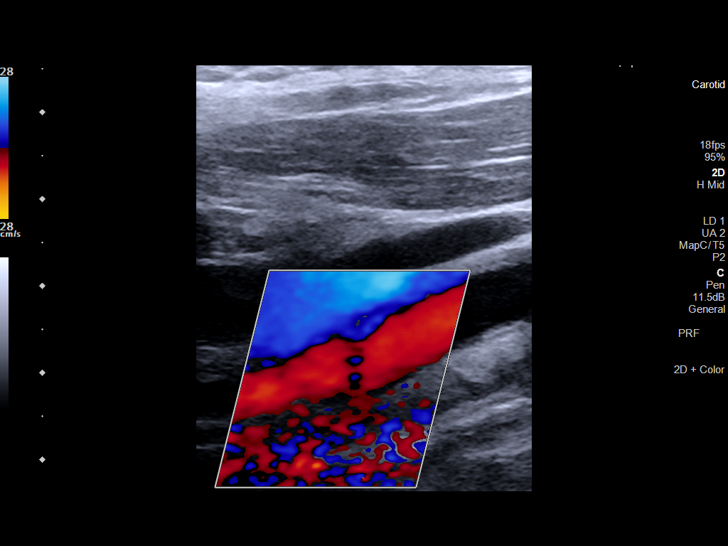
[im 28/64]
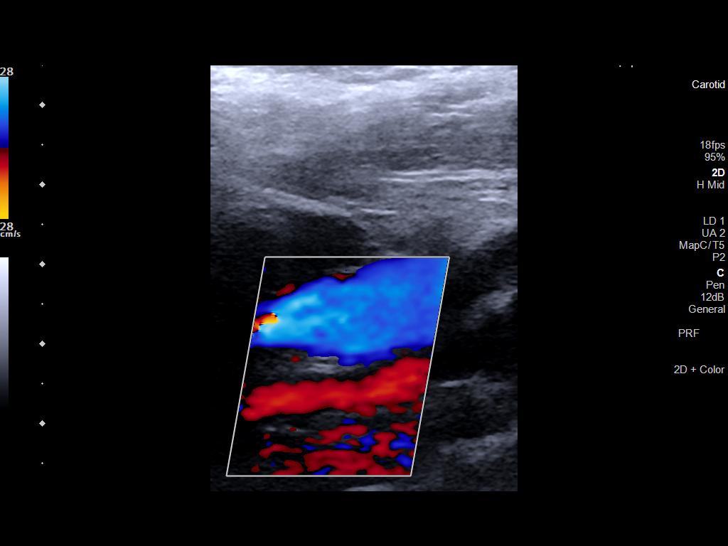
[im 33/64]
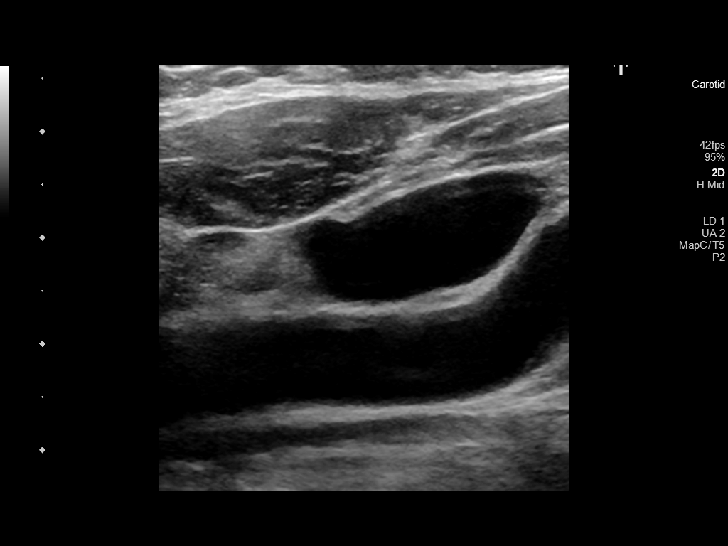
[im 36/64]
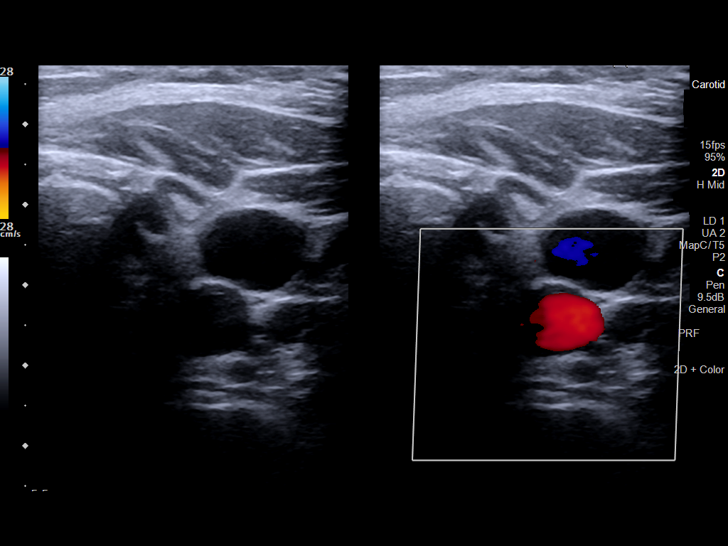
[im 42/64]
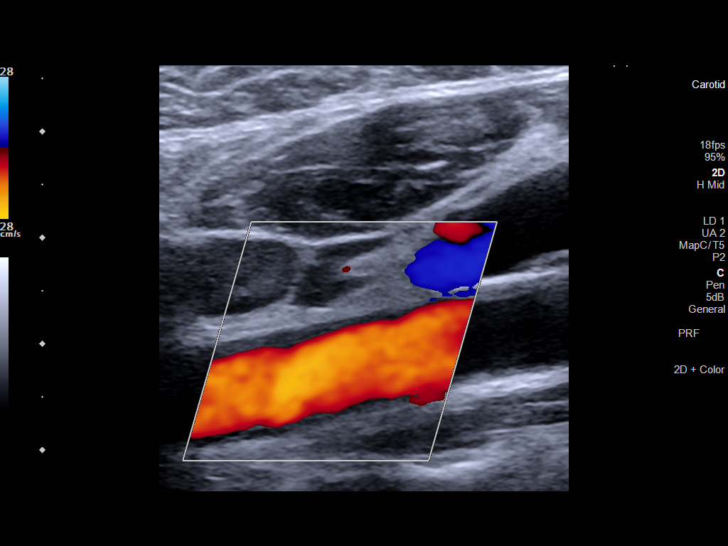
[im 47/64]
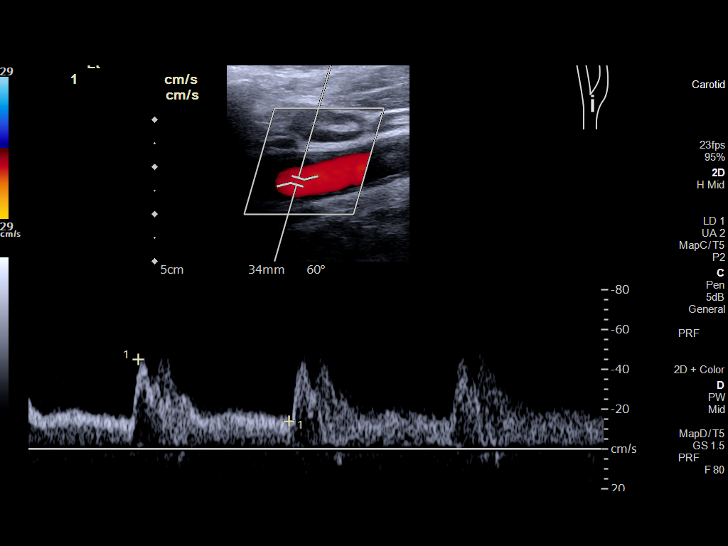
[im 53/64]
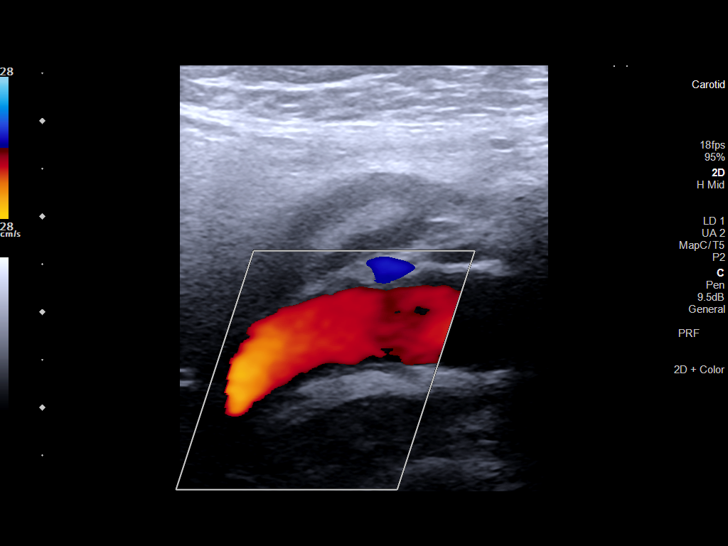
[im 58/64]
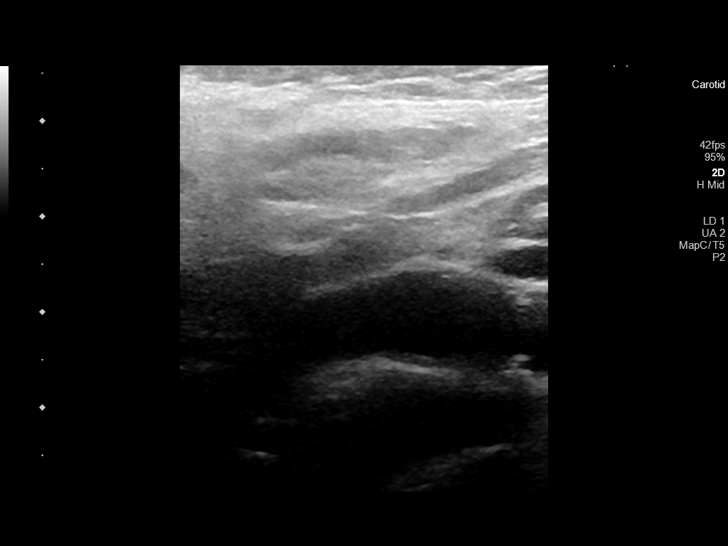
[im 64/64]
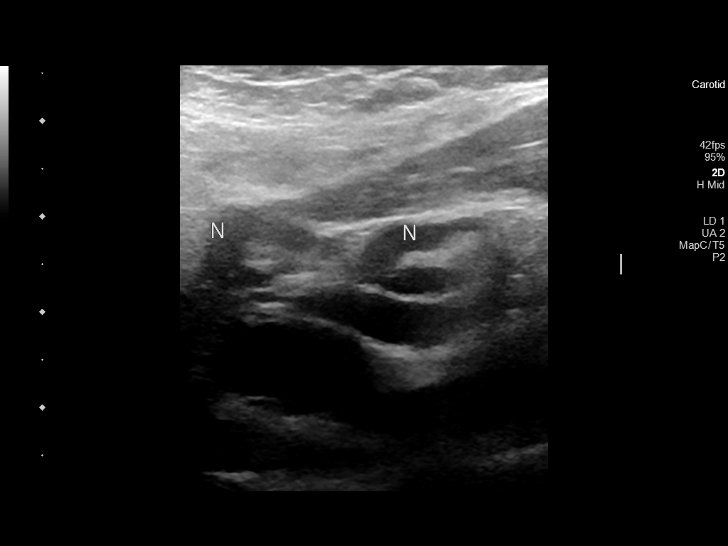

[13 of 24 positions shown; findings below may reference images not displayed]

FINDINGS: Criteria: Quantification of carotid stenosis is based on velocity
parameters that correlate the residual internal carotid diameter
with NASCET-based stenosis levels, using the diameter of the distal
internal carotid lumen as the denominator for stenosis measurement.

The following velocity measurements were obtained:

RIGHT

ICA: 71 cm/sec

CCA: 72 cm/sec

SYSTOLIC ICA/CCA RATIO:

ECA: 98 cm/sec

LEFT

ICA: 62 cm/sec

CCA: 100 cm/sec

SYSTOLIC ICA/CCA RATIO:

ECA: 60 cm/sec

RIGHT CAROTID ARTERY: Flow is antegrade. There is mild intimal
thickening without focal plaques in the common carotid artery. There
are small amounts of scattered non stenosing heterogeneous plaque in
the carotid bulb and proximal ICA.

RIGHT VERTEBRAL ARTERY:  Flow is well preserved and antegrade.

LEFT CAROTID ARTERY: There is mild intimal thickening in the common
carotid artery without focal plaques. There is a small amount of
scattered non stenosing heterogeneous plaque in the carotid bulb.

LEFT VERTEBRAL ARTERY:  Flow is well preserved and antegrade.

Upper extremity blood pressures: This information was not provided
by the technologist.

Other: 2 adjacent mildly prominent nonspecific anterior cervical
lymph nodes left neck. There is preservation of the fatty hila.
IMPRESSION: 1. No hemodynamically significant stenosis of either carotid artery
by ultrasound criteria.
2. Small amounts of nonstenosing heterogeneous plaque.
3. Nonspecific left anterior cervical lymph nodes.

## 2021-12-22 IMAGING — MR MR HEAD W/O CM
12 series · 43 of 48 positions shown · non-contrast
Comparison: None.

CLINICAL DATA: Dizziness with left-sided drift

EXAM:
MRI HEAD WITHOUT CONTRAST
TECHNIQUE: Multiplanar, multiecho pulse sequences of the brain and surrounding
structures were obtained without intravenous contrast.

[Series 5: ax dwi_tracew · axial · 3.0mm · 0.65mm/px · z∈[-59,+96]mm · 3 of 48 slices shown]
[im 1/48]
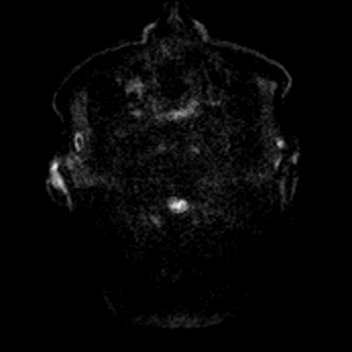
[im 24/48]
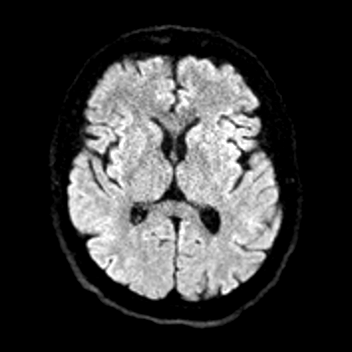
[im 48/48]
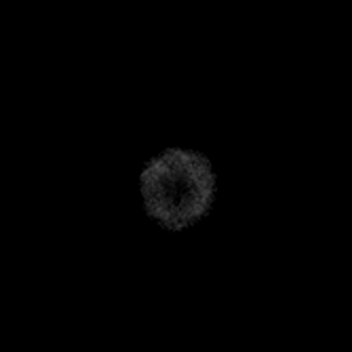

[Series 6: ax dwi_adc · axial · 3.0mm · 0.65mm/px · z∈[-59,+96]mm · 4 of 48 slices shown]
[im 1/48]
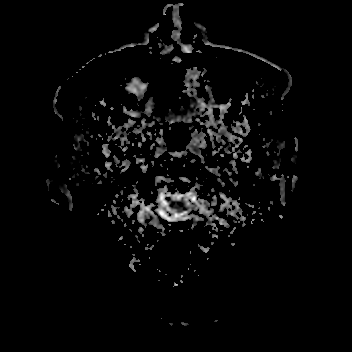
[im 16/48]
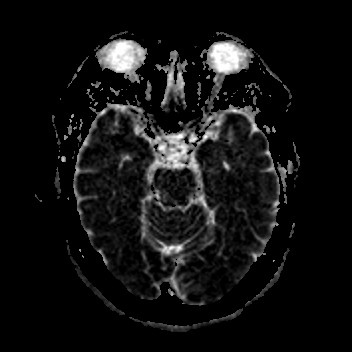
[im 32/48]
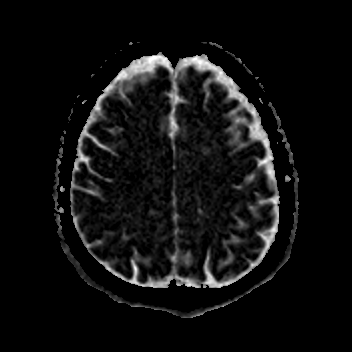
[im 48/48]
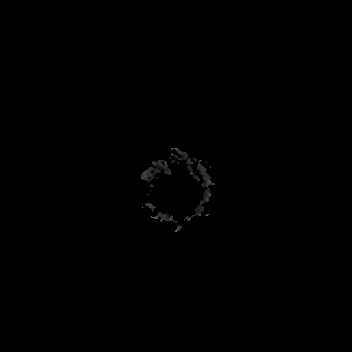

[Series 7: cor dwi_tracew · coronal · 5.0mm · 0.65mm/px · 3 of 40 slices shown]
[im 1/40]
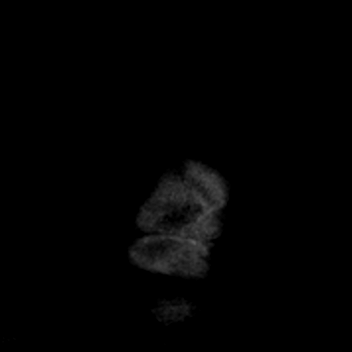
[im 20/40]
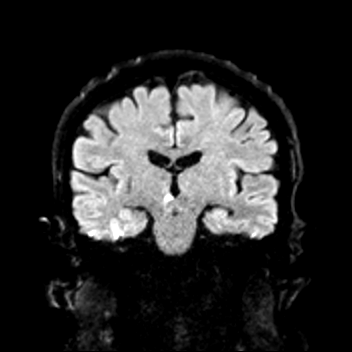
[im 40/40]
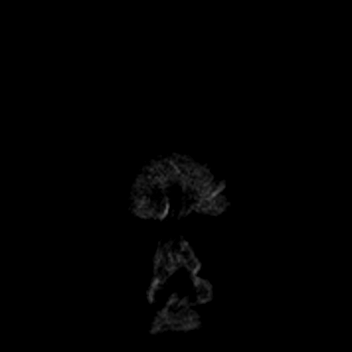

[Series 8: cor dwi_adc · coronal · 5.0mm · 0.65mm/px · 3 of 40 slices shown]
[im 1/40]
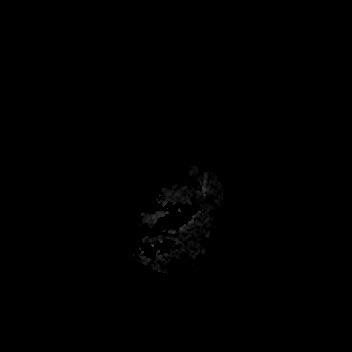
[im 20/40]
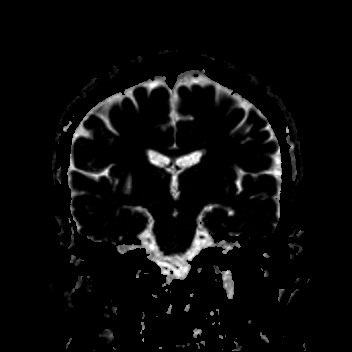
[im 40/40]
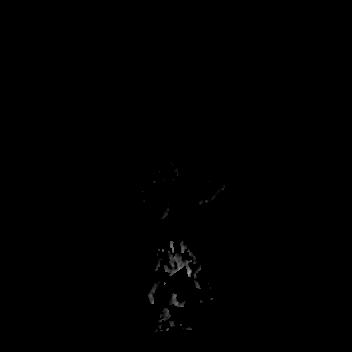

[Series 9: T1 · sagittal · 5.0mm · 0.62mm/px · 2 of 25 slices shown (1 of 2)]
[im 1/25]
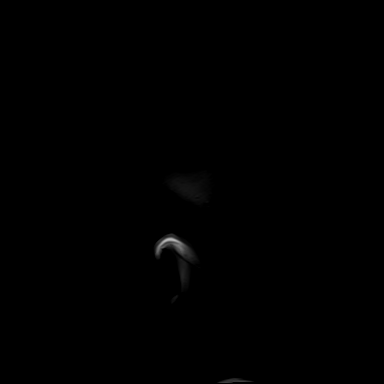
[im 25/25]
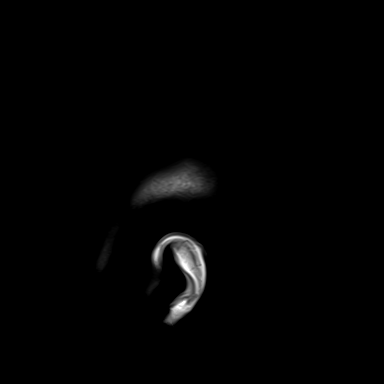

[Series 15: T2 · axial · 5.0mm · 0.53mm/px · z∈[-53,+90]mm · 2 of 25 slices shown (1 of 2)]
[im 1/25]
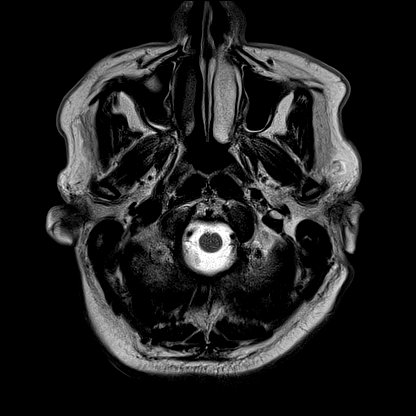
[im 25/25]
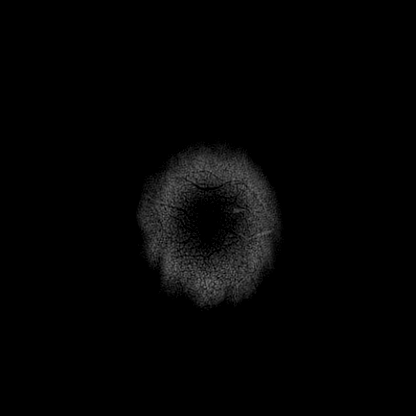

[Series 17: pha_images · axial · 3.0mm · 0.90mm/px · z∈[-70,+104]mm · 4 of 57 slices shown]
[im 1/57]
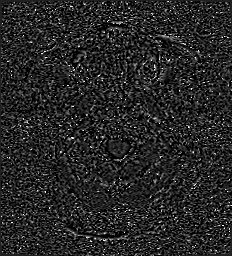
[im 19/57]
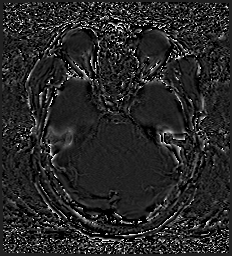
[im 38/57]
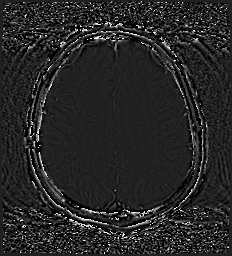
[im 57/57]
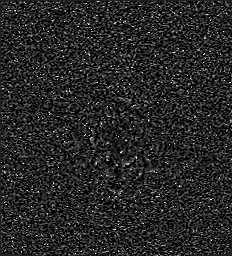

[Series 18: swi_images · axial · 3.0mm · 0.90mm/px · z∈[-70,+107]mm · 4 of 60 slices shown]
[im 1/60]
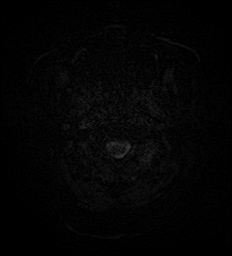
[im 20/60]
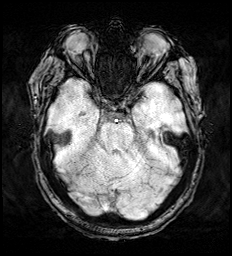
[im 40/60]
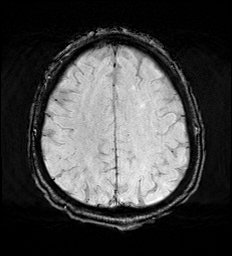
[im 60/60]
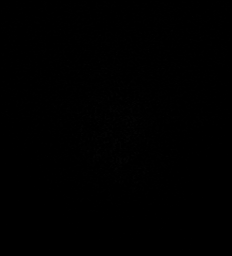

[Series 20: FLAIR · axial · 3.0mm · 0.53mm/px · z∈[-62,+99]mm · 4 of 55 slices shown (1 of 2)]
[im 1/55]
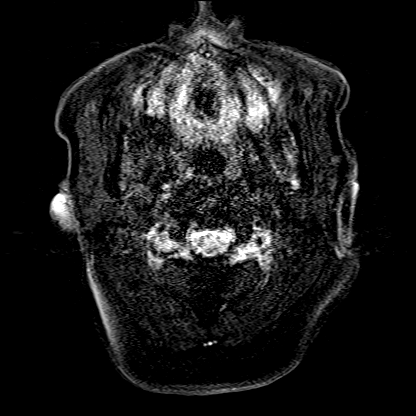
[im 19/55]
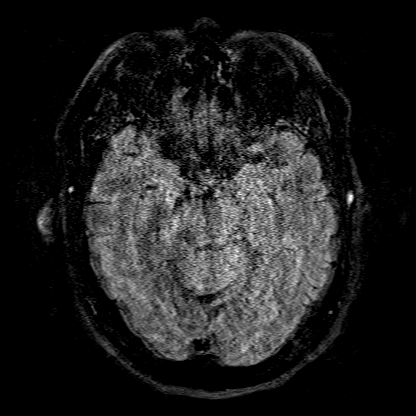
[im 37/55]
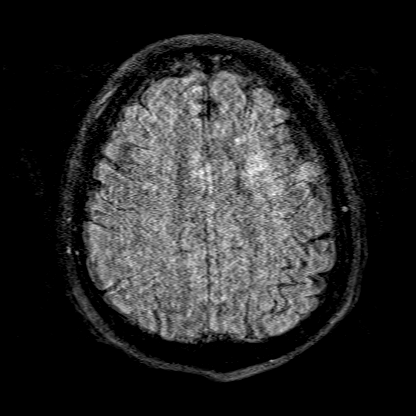
[im 55/55]
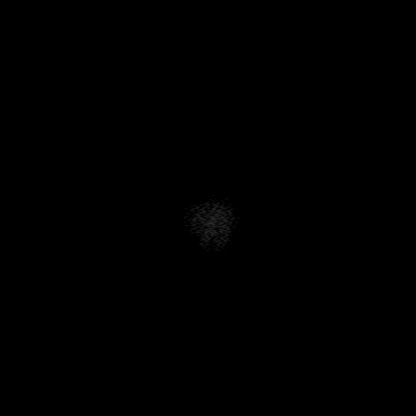

[Series 21: T1 · axial · 1.0mm · 0.98mm/px · z∈[-68,+107]mm · 8 of 175 slices shown (2 of 2)]
[im 1/175]
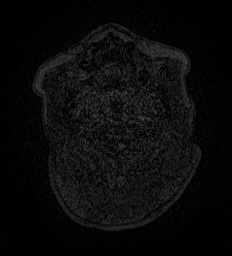
[im 30/175]
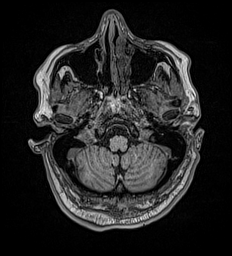
[im 59/175]
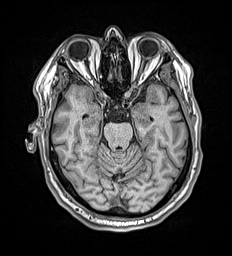
[im 73/175]
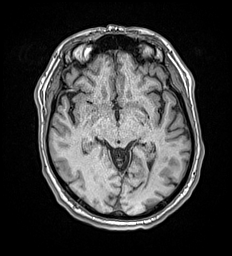
[im 102/175]
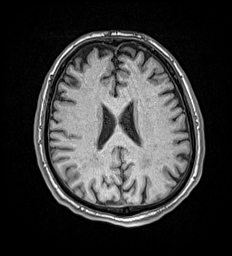
[im 117/175]
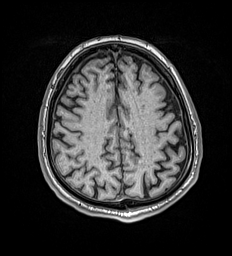
[im 146/175]
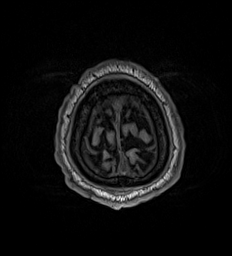
[im 175/175]
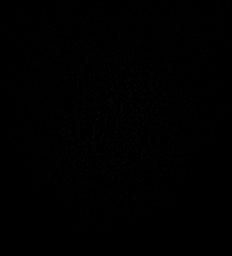

[Series 22: T2 · coronal · 5.0mm · 0.57mm/px · 2 of 29 slices shown (2 of 2)]
[im 1/29]
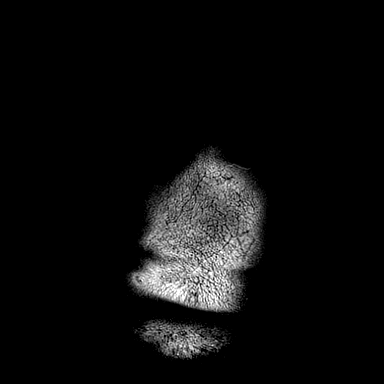
[im 29/29]
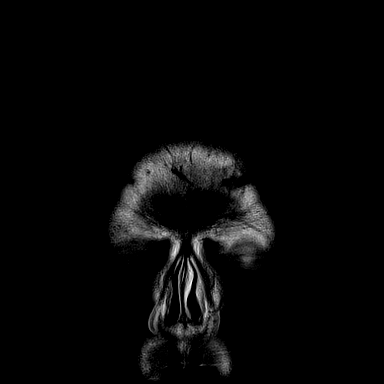

[Series 23: FLAIR · axial · 3.0mm · 0.53mm/px · z∈[-62,+99]mm · 4 of 55 slices shown (2 of 2)]
[im 1/55]
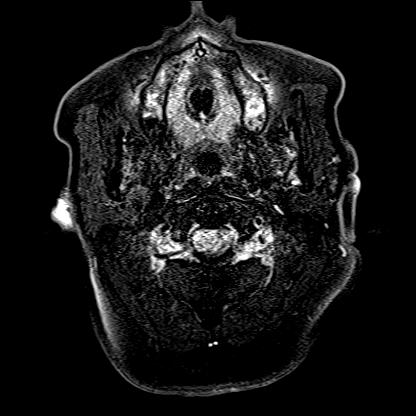
[im 19/55]
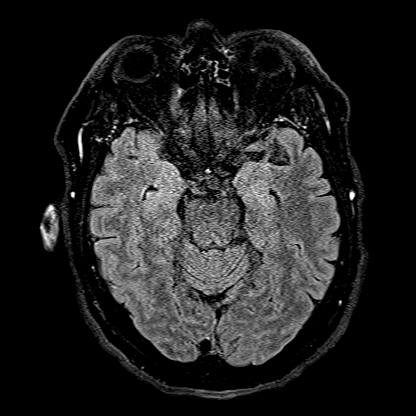
[im 37/55]
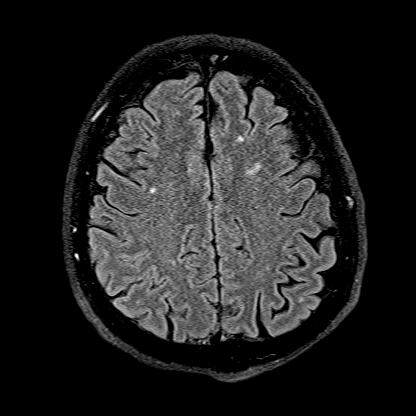
[im 55/55]
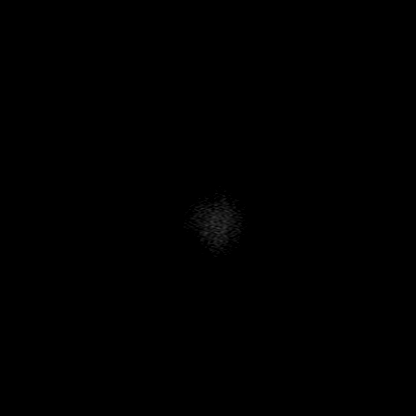

[43 of 48 positions shown; findings below may reference images not displayed]

FINDINGS: Brain: Small acute infarct of the dorsomedial right thalamus. No
acute or chronic hemorrhage. There is multifocal hyperintense
T2-weighted signal within the white matter. Parenchymal volume and
CSF spaces are normal. The midline structures are normal.

Vascular: Major flow voids are preserved.

Skull and upper cervical spine: Normal calvarium and skull base.
Visualized upper cervical spine and soft tissues are normal.

Sinuses/Orbits:No paranasal sinus fluid levels or advanced mucosal
thickening. No mastoid or middle ear effusion. Normal orbits.
IMPRESSION: 1. Small acute infarct of the dorsomedial right thalamus. No
hemorrhage or mass effect.
2. White matter changes most commonly indicating chronic small
vessel ischemia.

## 2021-12-22 MED ORDER — ACETAMINOPHEN 160 MG/5ML PO SOLN
650.0000 mg | ORAL | Status: DC | PRN
  Filled 2021-12-22: qty 20.3

## 2021-12-22 MED ORDER — INSULIN ASPART 100 UNIT/ML IJ SOLN
0.0000 [IU] | Freq: Three times a day (TID) | INTRAMUSCULAR | Status: DC
  Administered 2021-12-22: 3 [IU] via SUBCUTANEOUS
  Administered 2021-12-22: 11 [IU] via SUBCUTANEOUS
  Administered 2021-12-22: 8 [IU] via SUBCUTANEOUS
  Administered 2021-12-23: 15 [IU] via SUBCUTANEOUS
  Administered 2021-12-23: 8 [IU] via SUBCUTANEOUS
  Filled 2021-12-22 (×5): qty 1

## 2021-12-22 MED ORDER — INSULIN ASPART 100 UNIT/ML IJ SOLN
5.0000 [IU] | Freq: Once | INTRAMUSCULAR | Status: DC
Start: 2021-12-22 — End: 2021-12-22
  Filled 2021-12-22: qty 0.05

## 2021-12-22 MED ORDER — INSULIN ASPART 100 UNIT/ML IJ SOLN
0.0000 [IU] | Freq: Every day | INTRAMUSCULAR | Status: DC
  Administered 2021-12-22: 5 [IU] via SUBCUTANEOUS
  Filled 2021-12-22: qty 1

## 2021-12-22 MED ORDER — MECLIZINE HCL 25 MG PO TABS
25.0000 mg | ORAL_TABLET | Freq: Once | ORAL | Status: AC
  Administered 2021-12-22: 25 mg via ORAL
  Filled 2021-12-22: qty 1

## 2021-12-22 MED ORDER — FENOFIBRATE 160 MG PO TABS
160.0000 mg | ORAL_TABLET | Freq: Every day | ORAL | Status: DC
  Administered 2021-12-22 – 2021-12-23 (×2): 160 mg via ORAL
  Filled 2021-12-22 (×2): qty 1

## 2021-12-22 MED ORDER — IOHEXOL 350 MG/ML SOLN
100.0000 mL | Freq: Once | INTRAVENOUS | Status: AC | PRN
  Administered 2021-12-22: 100 mL via INTRAVENOUS

## 2021-12-22 MED ORDER — INSULIN STARTER KIT- PEN NEEDLES (ENGLISH)
1.0000 | Freq: Once | Status: DC
  Filled 2021-12-22: qty 1

## 2021-12-22 MED ORDER — INSULIN STARTER KIT- PEN NEEDLES (SPANISH)
1.0000 | Freq: Once | Status: AC
  Administered 2021-12-22: 1
  Filled 2021-12-22: qty 1

## 2021-12-22 MED ORDER — ENOXAPARIN SODIUM 60 MG/0.6ML IJ SOSY
0.5000 mg/kg | PREFILLED_SYRINGE | INTRAMUSCULAR | Status: DC
  Administered 2021-12-22: 42.5 mg via SUBCUTANEOUS
  Filled 2021-12-22: qty 0.6

## 2021-12-22 MED ORDER — ACETAMINOPHEN 325 MG PO TABS: 650.0000 mg | ORAL_TABLET | ORAL | Status: DC | PRN

## 2021-12-22 MED ORDER — LIVING WELL WITH DIABETES BOOK - IN SPANISH
Freq: Once | Status: AC
  Filled 2021-12-22: qty 1

## 2021-12-22 MED ORDER — ASPIRIN 325 MG PO TABS
325.0000 mg | ORAL_TABLET | Freq: Every day | ORAL | Status: DC
  Administered 2021-12-22 – 2021-12-23 (×2): 325 mg via ORAL
  Filled 2021-12-22 (×2): qty 1

## 2021-12-22 MED ORDER — INSULIN GLARGINE-YFGN 100 UNIT/ML ~~LOC~~ SOLN
10.0000 [IU] | Freq: Every day | SUBCUTANEOUS | Status: DC
  Administered 2021-12-22: 10 [IU] via SUBCUTANEOUS
  Filled 2021-12-22 (×2): qty 0.1

## 2021-12-22 MED ORDER — INSULIN REGULAR HUMAN 100 UNIT/ML IJ SOLN
5.0000 [IU] | Freq: Once | INTRAMUSCULAR | Status: DC
  Filled 2021-12-22: qty 3

## 2021-12-22 MED ORDER — SODIUM CHLORIDE 0.9 % IV BOLUS (SEPSIS)
1000.0000 mL | Freq: Once | INTRAVENOUS | Status: AC
  Administered 2021-12-22: 1000 mL via INTRAVENOUS

## 2021-12-22 MED ORDER — ATORVASTATIN CALCIUM 20 MG PO TABS
40.0000 mg | ORAL_TABLET | Freq: Every day | ORAL | Status: DC
  Administered 2021-12-22 – 2021-12-23 (×2): 40 mg via ORAL
  Filled 2021-12-22 (×2): qty 2

## 2021-12-22 MED ORDER — INSULIN ASPART 100 UNIT/ML IJ SOLN
5.0000 [IU] | Freq: Once | INTRAMUSCULAR | Status: DC
  Filled 2021-12-22: qty 0.05

## 2021-12-22 MED ORDER — LIVING WELL WITH DIABETES BOOK
Freq: Once | Status: DC
Start: 2021-12-22 — End: 2021-12-22
  Filled 2021-12-22: qty 1

## 2021-12-22 MED ORDER — ACETAMINOPHEN 650 MG RE SUPP: 650.0000 mg | RECTAL | Status: DC | PRN

## 2021-12-22 MED ORDER — STROKE: EARLY STAGES OF RECOVERY BOOK: Freq: Once | Status: AC

## 2021-12-22 NOTE — Progress Notes (Signed)
PHARMACIST - PHYSICIAN COMMUNICATION ? ?CONCERNING:  Enoxaparin (Lovenox) for DVT Prophylaxis  ? ? ?RECOMMENDATION: ?Patient was prescribed enoxaprin 40mg  q24 hours for VTE prophylaxis.  ? Danley Danker Weights  ? 12/21/21 1830  ?Weight: 87.1 kg (192 lb)  ? ? ?Body mass index is 30.07 kg/m?. ? ?Estimated Creatinine Clearance: 108.6 mL/min (by C-G formula based on SCr of 0.65 mg/dL). ? ? ?Based on [facility] patient is candidate for enoxaparin 0.5mg /kg TBW SQ every 24 hours based on BMI being >30. ? ?DESCRIPTION: ?Pharmacy has adjusted enoxaparin dose per [facility] policy. ? ?Patient is now receiving enoxaparin 0.5 mg/kg every 24 hours  ? ?Renda Rolls, PharmD, MBA ?12/22/2021 ?2:45 AM ? ?

## 2021-12-22 NOTE — ED Notes (Signed)
Pt demonstrated teachback on administration of insulin  ?

## 2021-12-22 NOTE — ED Notes (Signed)
Informed RN bed assigned 

## 2021-12-22 NOTE — Progress Notes (Signed)
SLP Cancellation Note ? ?Patient Details ?Name: Roy Walker ?MRN: [id number] ?DOB: 1966/05/10 ? ? ?Cancelled treatment:       Reason Eval/Treat Not Completed: SLP screened, no needs identified, will sign off (chart reviewed; consulted NSG re: pt's status then met w/ pt in room.) ?Pt denied any difficulty swallowing and is currently on a regular diet; tolerates swallowing pills w/ water per NSG. He had completed a full breakfast meal per tray on table. Pt conversed in conversation w/out expressive/receptive deficits noted; pt denied any speech-language deficits. Speech intelligible, clear.  ?He requested coffee and graham crackers which were provided. NSG aware.  ?No further skilled ST services indicated as pt appears at his baseline. Pt agreed. NSG to reconsult if any change in status while admitted.   ? ? ? ? ? ?Orinda Kenner, MS, CCC-SLP ?Speech Language Pathologist ?Rehab Services; [place] ?[phone] (ascom) ?Roy Walker ?12/22/2021, 11:34 AM ?

## 2021-12-22 NOTE — Evaluation (Signed)
Occupational Therapy Evaluation ?Patient Details ?Name: Roy Walker ?MRN: [id number] ?DOB: 04-Jul-1966 ?Today's Date: 12/22/2021 ? ? ?History of Present Illness Pt is 56 y/o male admitted and CT head with no acute intracranial finding however MRI showing small acute infarct of the dorsal medial right thalamus.  ? ?Clinical Impression ?  ?Patient presenting with decreased Ind in self care,balance, functional mobility/transfers, endurance, and safety awareness. Patient reports living at home with wife and children (ages 108 and 54). He reports being independent in at baseline, driving, and working third shift. He is alone often at home since he works different schedule from wife. Pt ambulates with close supervision secondary to needing cuing for safety with LOB towards the L but he is able to correct himself. Pt also reports vertical diplopia and an increase in double vision with scanning to the L .Patient will benefit from acute OT to increase overall independence in the areas of ADLs, functional mobility, and safety awareness in order to safely discharge home with family. ?   ? ?Recommendations for follow up therapy are one component of a multi-disciplinary discharge planning process, led by the attending physician.  Recommendations may be updated based on patient status, additional functional criteria and insurance authorization.  ? ?Follow Up Recommendations ? Outpatient OT (vision) ?  ?Assistance Recommended at Discharge Intermittent Supervision/Assistance  ?Patient can return home with the following A little help with walking and/or transfers;A little help with bathing/dressing/bathroom;Assist for transportation;Direct supervision/assist for medications management ? ?  ?Functional Status Assessment ? Patient has had a recent decline in their functional status and demonstrates the ability to make significant improvements in function in a reasonable and predictable amount of time.  ?Equipment  Recommendations ? None recommended by OT  ?  ?   ?Precautions / Restrictions Precautions ?Precautions: Fall  ? ?  ? ?Mobility Bed Mobility ?Overal bed mobility: Modified Independent ?  ?  ?  ?  ?  ?  ?  ?  ? ?Transfers ?Overall transfer level: Modified independent ?Equipment used: None ?  ?  ?  ?  ?  ?  ?  ?  ?  ? ?  ?Balance Overall balance assessment: Needs assistance ?Sitting-balance support: Feet supported ?Sitting balance-Leahy Scale: Normal ?  ?  ?Standing balance support: During functional activity ?Standing balance-Leahy Scale: Fair ?  ?  ?  ?  ?  ?  ?  ?  ?  ?  ?  ?  ?   ? ?ADL either performed or assessed with clinical judgement  ? ?ADL   ?  ?  ?  ?  ?  ?  ?  ?  ?  ?  ?  ?  ?  ?  ?  ?  ?  ?  ?  ?General ADL Comments: supervision for safety awareness  ? ? ? ?Vision Patient Visual Report: Diplopia ?Additional Comments: Pt able to track without difficulty. No nystagmus noted. Pt reports vertical diplopia and more so when looking to the L visual field.  ?   ?   ?   ? ?Pertinent Vitals/Pain Pain Assessment ?Pain Assessment: No/denies pain  ? ? ? ?Hand Dominance Right ?  ?Extremity/Trunk Assessment Upper Extremity Assessment ?Upper Extremity Assessment: Overall WFL for tasks assessed ?  ?Lower Extremity Assessment ?Lower Extremity Assessment: Overall WFL for tasks assessed ?  ?  ?  ?Communication Communication ?Communication: No difficulties ?  ?Cognition Arousal/Alertness: Awake/alert ?Behavior During Therapy: [facility] for tasks assessed/performed ?Overall Cognitive Status: Within Functional Limits for  tasks assessed ?  ?  ?  ?  ?  ?  ?  ?  ?  ?  ?  ?  ?  ?  ?  ?  ?  ?  ?  ?   ?   ?   ? ? ?Home Living Family/patient expects to be discharged to:: Private residence ?Living Arrangements: Spouse/significant other;Children ?Available Help at Discharge: Family;Available PRN/intermittently ?Type of Home: House ?Home Access: Stairs to enter ?Entrance Stairs-Number of Steps: 2 STE ?  ?Home Layout: Multi-level ?Alternate  Level Stairs-Number of Steps: flight to basement where his bedroom is ?  ?Bathroom Shower/Tub: Tub/shower unit ?  ?  ?  ?  ?Home Equipment: None ?  ?  ?  ? ?  ?Prior Functioning/Environment Prior Level of Function : Independent/Modified Independent ?  ?  ?  ?  ?  ?  ?  ?ADLs Comments: Pt reports working full time (3rd shift) and is independent in all aspects of care. ?  ? ?  ?  ?OT Problem List: Impaired vision/perception;Impaired balance (sitting and/or standing) ?  ?   ?OT Treatment/Interventions: Visual/perceptual remediation/compensation;Balance training;Self-care/ADL training  ?  ?OT Goals(Current goals can be found in the care plan section) Acute Rehab OT Goals ?Patient Stated Goal: to go home ?OT Goal Formulation: With patient ?Time For Goal Achievement: 01/05/22 ?Potential to Achieve Goals: Fair ?ADL Goals ?Pt Will Perform Grooming: with modified independence ?Pt Will Perform Lower Body Dressing: with modified independence ?Pt Will Transfer to Toilet: with modified independence ?Pt Will Perform Toileting - Clothing Manipulation and hygiene: with modified independence  ?OT Frequency: Min 2X/week ?  ? ?   ?AM-PAC OT "6 Clicks" Daily Activity     ?Outcome Measure Help from another person eating meals?: None ?Help from another person taking care of personal grooming?: None ?Help from another person toileting, which includes using toliet, bedpan, or urinal?: A Little ?Help from another person bathing (including washing, rinsing, drying)?: A Little ?Help from another person to put on and taking off regular upper body clothing?: None ?Help from another person to put on and taking off regular lower body clothing?: A Little ?6 Click Score: 21 ?  ?End of Session Nurse Communication: Mobility status ? ?Activity Tolerance: Patient tolerated treatment well ?Patient left: in bed;with call bell/phone within reach ? ?OT Visit Diagnosis: Unsteadiness on feet (R26.81);Other (comment) (visual disturbance)  ?               ?Time: 2500-3704 ?OT Time Calculation (min): 17 min ?Charges:  OT General Charges ?$OT Visit: 1 Visit ?OT Evaluation ?$OT Eval Low Complexity: 1 Low ?OT Treatments ?$Neuromuscular Re-education: 8-22 mins ?Jackquline Denmark, MS, OTR/L , CBIS ?ascom [phone]  ?12/22/21, 1:44 PM  ?

## 2021-12-22 NOTE — ED Notes (Signed)
Pt administered morning medications, and self administered insulin after education review. Pt given TV remote and is currently resting.  ?

## 2021-12-22 NOTE — Progress Notes (Signed)
No charge note.  ? ?Patient seen and examined this morning, admitted overnight, H&P reviewed and agree with the assessment and plan  ? ?This is a 56 year old male with history of type 2 diabetes mellitus, hypertension, who came into the hospital with dizziness and spinning feeling, as well as double vision when both eyes are open but improves when he covers one up.  He reports a remote similar episode of dizziness self resolved.  MRI on admission showed small acute infarct in the dorsal medial right thalamus.  Neurology was consulted and he was admitted to the hospital ? ?Principal problem ?Acute CVA-neurology consulted, appreciate input.  Currently started on aspirin along with statin.  Carotid ultrasound without any hemodynamically significant stenosis.  A 2D echo has been done but pending.  A bubble study 2D echo as well as an MRA pending at this time.  PT recommends outpatient PT.  LDL was elevated 139.  A1c is 14.  Triglycerides 734 ? ?Active problems ?Diabetes mellitus, poorly controlled, with hyperglycemia-A1c 14.  He is only taking metformin as well as glimepiride.  He was given insulin sometimes in the past but not taking it.  He is not checking his sugars regularly.  He was started here on long-acting along with sliding scale.  Diabetes educator consulted.  Have asked the bedside RN to teach patient to self administer insulin as he will need insulin on discharge.  Continue to monitor CBGs and adjust current regimen as indicated ? ?CBG (last 3)  ?Recent Labs  ?  12/22/21 ?0419 12/22/21 ?0722 12/22/21 ?1240  ?GLUCAP 231* 257* 335*  ? ?Hyperlipidemia, hypertriglyceridemia-start statin along with Tricor.  Educated regarding dietary changes ? ?Borderline obesity, class I-BMI 30.0, he will benefit from weight loss ? ?Essential hypertension-resume home medications on discharge ? ?BP (!) 136/96 (BP Location: Left Arm)   Pulse 72   Temp 98.2 ?F (36.8 ?C)   Resp 20   Ht 5\' 7"  (1.702 m)   Wt 87.1 kg   SpO2 96%    BMI 30.07 kg/m?  ? ?Scheduled Meds: ? aspirin  325 mg Oral Daily  ? atorvastatin  40 mg Oral Daily  ? enoxaparin (LOVENOX) injection  0.5 mg/kg Subcutaneous Q24H  ? fenofibrate  160 mg Oral Daily  ? insulin aspart  0-15 Units Subcutaneous TID WC  ? insulin aspart  0-5 Units Subcutaneous QHS  ? insulin glargine-yfgn  10 Units Subcutaneous Daily  ? ?Continuous Infusions: ?PRN Meds:.acetaminophen **OR** acetaminophen (TYLENOL) oral liquid 160 mg/5 mL **OR** acetaminophen ? ?Boyd Litaker M. , MD, PhD ?[facility] ? ?Between 7 am - 7 pm you can contact me via Amion (for emergencies) or Securechat (non urgent matters).  ?I am not available 7 pm - 7 am, please contact night coverage MD/APP via Amion ?

## 2021-12-22 NOTE — Assessment & Plan Note (Signed)
Permissive hypertension 

## 2021-12-22 NOTE — Assessment & Plan Note (Addendum)
Permissive hypertension for first 24-48 hrs post stroke onset: Prn Labetalol IV or Vasotec IV If BP greater than 220/120  ?Statins for LDL goal less than 70 ?ASA 325 x 1 then 81mg  daily ?Telemetry, echo and carotid Doppler ?Avoid dextrose containing fluids, Maintain euglycemia, euthermia ?Neuro checks q4 hrs x 24 hrs and then per shift ?Head of bed 30 degrees ?Physical therapy/Occupational therapy/Speech therapy if failed dysphagia screen ?Neurology consult to follow ? ?

## 2021-12-22 NOTE — ED Provider Notes (Signed)
? ?[facility] ?Provider Note ? ? ? Event Date/Time  ? First MD Initiated Contact with Patient 12/22/21 0045   ?  (approximate) ? ? ?History  ? ?Dizziness ? ? ?HPI ? ?Roy Walker is a 56 y.o. male with history of hypertension, noninsulin-dependent diabetes who presents to the emergency department with complaints of dizziness.  States symptoms started Sunday morning April 16.  Describes it as a spinning feeling and having double vision with both of his eyes open.  He states however when he covers 1 eye at a time his vision appears normal.  No vision loss.  No numbness, tingling, weakness.  No headache or head injury.  Not on blood thinners.  Has had similar symptoms once before that resolved on their own and he was not seen by a doctor at that time.  No hearing loss, tinnitus, ear pain.  No chest pain or shortness of breath.  No vomiting or diarrhea.  Has a hard time walking due to his symptoms and feels like he is leaning to the left. ? ? ?History provided by patient. ? ? ? ?Past Medical History:  ?Diagnosis Date  ? Diabetes mellitus without complication ([place])   ? Hypertension   ? ? ?Past Surgical History:  ?Procedure Laterality Date  ? HERNIA REPAIR    ? UMBILICAL HERNIA REPAIR N/A 05/26/2018  ? Procedure: HERNIA REPAIR UMBILICAL ADULT;  Surgeon: Benjamine Sprague, DO;  Location: [facility] ORS;  Service: General;  Laterality: N/A;  ? ? ?MEDICATIONS:  ?Prior to Admission medications   ?Medication Sig Start Date End Date Taking? Authorizing Provider  ?blood glucose meter kit and supplies KIT Dispense based on patient and insurance preference. Check blood sugar three times daily before meals. 10/19/16   [name], MD  ?dicloxacillin (DYNAPEN) 500 MG capsule Take 1 capsule (500 mg total) by mouth 4 (four) times daily. 09/16/20   [name], MD  ?losartan (COZAAR) 50 MG tablet Take 50 mg by mouth daily.    [provider]  ?metFORMIN (GLUCOPHAGE) 500 MG tablet Take 1  tablet (500 mg total) by mouth 2 (two) times daily with a meal. 10/19/16   [name], MD  ? ? ?Physical Exam  ? ?Triage Vital Signs: ?ED Triage Vitals  ?Enc Vitals Group  ?   BP 12/21/21 1810 (!) 145/96  ?   Pulse Rate 12/21/21 1810 78  ?   Resp 12/21/21 1810 18  ?   Temp 12/21/21 1810 99.1 ?F (37.3 ?C)  ?   Temp Source 12/21/21 1810 Oral  ?   SpO2 12/21/21 1810 (!) 17 %  ?   Weight 12/21/21 1830 192 lb (87.1 kg)  ?   Height 12/21/21 1830 '5\' 7"'  (1.702 m)  ?   Head Circumference --   ?   Peak Flow --   ?   Pain Score 12/21/21 1830 0  ?   Pain Loc --   ?   Pain Edu? --   ?   Excl. in [place]? --   ? ? ?Most recent vital signs: ?Vitals:  ? 12/21/21 1810 12/22/21 0051  ?BP: (!) 145/96 (!) 139/96  ?Pulse: 78 75  ?Resp: 18 16  ?Temp: 99.1 ?F (37.3 ?C)   ?SpO2: (!) 17% 97%  ? ? ?CONSTITUTIONAL: Alert and oriented and responds appropriately to questions. Well-appearing; well-nourished ?HEAD: Normocephalic, atraumatic ?EYES: Conjunctivae clear, pupils appear equal, sclera nonicteric ?ENT: normal nose; moist mucous membranes ?NECK: Supple, normal ROM ?CARD: RRR; S1 and S2 appreciated;  no murmurs, no clicks, no rubs, no gallops ?RESP: Normal chest excursion without splinting or tachypnea; breath sounds clear and equal bilaterally; no wheezes, no rhonchi, no rales, no hypoxia or respiratory distress, speaking full sentences ?ABD/GI: Normal bowel sounds; non-distended; soft, non-tender, no rebound, no guarding, no peritoneal signs ?BACK: The back appears normal ?EXT: Normal ROM in all joints; no deformity noted, no edema; no cyanosis ?SKIN: Normal color for age and race; warm; no rash on exposed skin ?NEURO: Moves all extremities equally, normal speech, cranial nerves II through XII intact, no pronator drift, sensation to light touch intact diffusely, ambulates with steady gait with 2 person assist ?PSYCH: The patient's mood and manner are appropriate. ? ? ?ED Results / Procedures / Treatments  ? ?LABS: ?(all labs ordered are  listed, but only abnormal results are displayed) ?Labs Reviewed  ?COMPREHENSIVE METABOLIC PANEL - Abnormal; Notable for the following components:  ?    Result Value  ? Sodium 133 (*)   ? Glucose, Bld 439 (*)   ? ALT 51 (*)   ? All other components within normal limits  ?TROPONIN I (HIGH SENSITIVITY) - Abnormal; Notable for the following components:  ? Troponin I (High Sensitivity) 22 (*)   ? All other components within normal limits  ?TROPONIN I (HIGH SENSITIVITY) - Abnormal; Notable for the following components:  ? Troponin I (High Sensitivity) 18 (*)   ? All other components within normal limits  ?CBC WITH DIFFERENTIAL/PLATELET  ?PROTIME-INR  ?URINALYSIS, ROUTINE W REFLEX MICROSCOPIC  ?APTT  ?CBG MONITORING, ED  ? ? ? ?EKG: ? EKG Interpretation ? ?Date/Time:  Monday December 21 2021 18:05:39 EDT ?Ventricular Rate:  78 ?PR Interval:  160 ?QRS Duration: 86 ?QT Interval:  400 ?QTC Calculation: 456 ?R Axis:   117 ?Text Interpretation: Normal sinus rhythm Left posterior fascicular block Abnormal ECG When compared with ECG of 18-May-2018 08:02, Left posterior fascicular block is now Present T wave amplitude has decreased in Lateral leads Confirmed by Pryor Curia [phone]) on 12/22/2021 12:45:49 AM ?  ? ?  ? ? ? ?RADIOLOGY: ?My personal review and interpretation of imaging: MRI of the brain shows an acute right thalamic infarct.  CT head showed no acute abnormality. ? ?I have personally reviewed all radiology reports.   ?CT HEAD WO CONTRAST (5MM) ? ?Result Date: 12/21/2021 ?CLINICAL DATA:  Dizziness EXAM: CT HEAD WITHOUT CONTRAST TECHNIQUE: Contiguous axial images were obtained from the base of the skull through the vertex without intravenous contrast. RADIATION DOSE REDUCTION: This exam was performed according to the departmental dose-optimization program which includes automated exposure control, adjustment of the mA and/or kV according to patient size and/or use of iterative reconstruction technique. COMPARISON:  None.  FINDINGS: Brain: No evidence of acute infarction, hemorrhage, hydrocephalus, extra-axial collection or mass lesion/mass effect. Scattered low-density changes within the periventricular and subcortical white matter compatible with chronic microvascular ischemic change. Vascular: Atherosclerotic calcifications involving the large vessels of the skull base. No unexpected hyperdense vessel. Skull: Normal. Negative for fracture or focal lesion. Sinuses/Orbits: No acute finding. Other: None. IMPRESSION: 1. No acute intracranial abnormality. 2. Mild chronic microvascular ischemic change. Electronically Signed   By: Davina Poke D.O.   On: 12/21/2021 18:43  ? ?MR BRAIN WO CONTRAST ? ?Result Date: 12/22/2021 ?CLINICAL DATA:  Dizziness with left-sided drift EXAM: MRI HEAD WITHOUT CONTRAST TECHNIQUE: Multiplanar, multiecho pulse sequences of the brain and surrounding structures were obtained without intravenous contrast. COMPARISON:  None. FINDINGS: Brain: Small acute infarct of the dorsomedial right  thalamus. No acute or chronic hemorrhage. There is multifocal hyperintense T2-weighted signal within the white matter. Parenchymal volume and CSF spaces are normal. The midline structures are normal. Vascular: Major flow voids are preserved. Skull and upper cervical spine: Normal calvarium and skull base. Visualized upper cervical spine and soft tissues are normal. Sinuses/Orbits:No paranasal sinus fluid levels or advanced mucosal thickening. No mastoid or middle ear effusion. Normal orbits. IMPRESSION: 1. 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 No hemorrhage or mass effect. 2. White matter changes most commonly indicating chronic small vessel ischemia. Electronically Signed   By: Ulyses Jarred M.D.   On: 12/22/2021 01:49   ? ? ?PROCEDURES: ? ?Critical Care performed: Yes, see critical care procedure note(s) ? ? ?CRITICAL CARE ?Performed by: Cyril Mourning Dael Howland ? ? ?Total critical care time: 45 minutes ? ?Critical care  time was exclusive of separately billable procedures and treating other patients. ? ?Critical care was necessary to treat or prevent imminent or life-threatening deterioration. ? ?Critical care was time spent

## 2021-12-22 NOTE — Consult Note (Addendum)
?                    NEURO HOSPITALIST CONSULT NOTE  ? ?Requestig physician: Dr. Cruzita Lederer ? 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 ?History obtained from:  Patient and Chart    ? ?HPI:                                                                                                                                         ? Roy Walker is an 56 y.o. male with a history of DM2 and HTN who presented to the ED on Monday evening with spinning dizziness, binocular double vision (vertical double vision) and a sensation of falling to his left side. He denied any weakness or sensory numbness; also did not have vision loss, headache or paresthesias. His symptoms had started on Sunday morning. Of note, he had had similar symptoms previously that had resolved on their own; the patient had not been seen by a physician at that time  ? ?BP was 145/96 in the ED. Blood glucose in the ED was significantly elevated, as was HgbA1c and U/A showed glycosuria. Other labs were negative for DKA.  ? ?Past Medical History:  ?Diagnosis Date  ? Diabetes mellitus without complication ([place])   ? Hypertension   ? ? ?Past Surgical History:  ?Procedure Laterality Date  ? HERNIA REPAIR    ? UMBILICAL HERNIA REPAIR N/A 05/26/2018  ? Procedure: HERNIA REPAIR UMBILICAL ADULT;  Surgeon: Benjamine Sprague, DO;  Location: [facility] ORS;  Service: General;  Laterality: N/A;  ? ? ?History reviewed. No pertinent family history.          ? ?Social History:  reports that he quit smoking about 16 years ago. His smoking use included cigarettes. He has never used smokeless tobacco. He reports current alcohol use of about 10.0 standard drinks per week. He reports that he does not use drugs. ? ?No Known Allergies ? ?MEDICATIONS:                                                                                                                     ?No current facility-administered medications on file prior to encounter.   ? ?Current Outpatient Medications on File Prior to Encounter  ?Medication Sig Dispense Refill  ? blood glucose meter kit and supplies KIT Dispense based on patient and insurance preference.  Check blood sugar three times daily before meals. 1 each 0  ? dicloxacillin (DYNAPEN) 500 MG capsule Take 1 capsule (500 mg total) by mouth 4 (four) times daily. 56 capsule 0  ? losartan (COZAAR) 50 MG tablet Take 50 mg by mouth daily.    ? metFORMIN (GLUCOPHAGE) 500 MG tablet Take 1 tablet (500 mg total) by mouth 2 (two) times daily with a meal. 60 tablet 0  ? ? ? ?Scheduled: ?  stroke: early stages of recovery book   Does not apply Once  ? aspirin  325 mg Oral Daily  ? atorvastatin  40 mg Oral Daily  ? enoxaparin (LOVENOX) injection  0.5 mg/kg Subcutaneous Q24H  ? fenofibrate  160 mg Oral Daily  ? insulin aspart  0-15 Units Subcutaneous TID WC  ? insulin aspart  0-5 Units Subcutaneous QHS  ? insulin glargine-yfgn  10 Units Subcutaneous Daily  ? ? ? ?ROS:                                                                                                                                       ?No hearing loss. NO CP or SOB. No vomiting. Other ROS as per HPI.   ? ?Blood pressure (!) 154/97, pulse 69, temperature 99.1 ?F (37.3 ?C), temperature source Oral, resp. rate 18, height '5\' 7"'  (1.702 m), weight 87.1 kg, SpO2 96 %. ? ? ?General Examination:                                                                                                      ? ?Physical Exam  ?HEENT-  [place]/AT   ?Lungs- Respirations unlabored ?Extremities- No edema ? ?Neurological Examination ?Mental Status: Awake and alert. Oriented to the city, state, year and month, but states the day incorrectly ("Wednesday"). Speech fluent with intact comprehension. No dysarthria.  ?Cranial Nerves: ?II: Temporal visual fields intact with no extinction to DSS.  ?III,IV, VI: Left sided ptosis is noted. Gaze is intact horizontally but with vertical diplopia worse with central and  left gaze, improved on right lateral gaze. Double vision is at its worst with upgaze and there is also pronounced weakness of upgaze in left eye visible to the examiner, without impairment in the right eye. No double vision with downgaze. No nystagmus.  ?VII: Smile symmetric ?VIII: Hearing intact to conversation ?IX,X: No hypophonia or hoarseness ?XI: Symmetric shoulder shrug ?XII: No lingual dysarthria ?Motor: ?RUE 5/5 ?LUE 5/5 but holds arm about 5 cm lower than right  when holding arms extended at 90 degrees against gravity with eyes closed.  ?RLE 5/5 ?LLE 5/5 ?Sensory: Light touch intact in al 4 extremities without asymmetry. No extinction to DSS.  ?Deep Tendon Reflexes: 1+ biceps and brachioradialis bilaterally. Trace patellar reflexes bilaterally.  ?Cerebellar: No ataxia on the right with FNF. Mild dysmetria with FNF on the left.  ?Gait: Wide based with listing to the left while ambulating. While sitting, the patient also tends to list to the left during strength testing.  ?  ?Lab Results: ?Basic Metabolic Panel: ?Recent Labs  ?Lab 12/21/21 ?1812 12/22/21 ?0242  ?NA 133*  --   ?K 4.1  --   ?CL 98  --   ?CO2 24  --   ?GLUCOSE 439*  --   ?BUN 16  --   ?CREATININE 0.65 0.48*  ?CALCIUM 9.1  --   ? ? ?CBC: ?Recent Labs  ?Lab 12/21/21 ?1812 12/22/21 ?0242  ?WBC 6.5 7.1  ?NEUTROABS 2.7  --   ?HGB 15.9 15.0  ?HCT 46.9 44.9  ?MCV 93.6 95.1  ?PLT 207 197  ? ? ?Cardiac Enzymes: ?No results for input(s): CKTOTAL, CKMB, CKMBINDEX, TROPONINI in the last 168 hours. ? ?Lipid Panel: ?Recent Labs  ?Lab 12/22/21 ?5188  ?CHOL 296*  ?TRIG 734*  ?HDL 33*  ?CHOLHDL 9.0  ?VLDL UNABLE TO CALCULATE IF TRIGLYCERIDE OVER 400 mg/dL  ?LDLCALC UNABLE TO CALCULATE IF TRIGLYCERIDE OVER 400 mg/dL  ? ? ?Imaging: ?CT HEAD WO CONTRAST (5MM) ? ?Result Date: 12/21/2021 ?CLINICAL DATA:  Dizziness EXAM: CT HEAD WITHOUT CONTRAST TECHNIQUE: Contiguous axial images were obtained from the base of the skull through the vertex without intravenous contrast.  RADIATION DOSE REDUCTION: This exam was performed according to the departmental dose-optimization program which includes automated exposure control, adjustment of the mA and/or kV according to patient size and/or use of iterative reconstruction technique. COMPARISON:  None. FINDINGS: Brain: No evidence of acute infarction, hemorrhage, hydrocephalus, extra-axial collection or mass lesion/mass effect. Scattered low-density changes within the periventricular and subcortical white matter compatible with chronic microvascular ischemic change. Vascular: Atherosclerotic calcifications involving the large vessels of the skull base. No unexpected hyperdense vessel. Skull: Normal. Negative for fracture or focal lesion. Sinuses/Orbits: No acute finding. Other: None. IMPRESSION: 1. No acute intracranial abnormality. 2. Mild chronic microvascular ischemic change. Electronically Signed   By: Davina Poke D.O.   On: 12/21/2021 18:43  ? ?MR BRAIN WO CONTRAST ? ?Result Date: 12/22/2021 ?CLINICAL DATA:  Dizziness with left-sided drift EXAM: MRI HEAD WITHOUT CONTRAST TECHNIQUE: Multiplanar, multiecho pulse sequences of the brain and surrounding structures were obtained without intravenous contrast. COMPARISON:  None. 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 No acute or chronic hemorrhage. There is multifocal hyperintense T2-weighted signal within the white matter. Parenchymal volume and CSF spaces are normal. The midline structures are normal. Vascular: Major flow voids are preserved. Skull and upper cervical spine: Normal calvarium and skull base. Visualized upper cervical spine and soft tissues are normal. Sinuses/Orbits:No paranasal sinus fluid levels or advanced mucosal thickening. No mastoid or middle ear effusion. Normal orbits. IMPRESSION: 1. 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 No hemorrhage or mass effect. 2. White matter changes most commonly indicating chronic small vessel  ischemia. Electronically Signed   By: Ulyses Jarred M.D.   On: 12/22/2021 01:49  ? ?US Carotid Bilateral (at [facility] and [facility] only) ? ?Result Date: 12/22/2021 ?CLINICAL DATA:  Diabetes and hypertension.  [id number]. EXAM:

## 2021-12-22 NOTE — Assessment & Plan Note (Addendum)
IV insulin x1 dose then sliding scale coverage ?

## 2021-12-22 NOTE — Evaluation (Signed)
Physical Therapy Evaluation ?Patient Details ?Name: Roy Walker ?MRN: [id number] ?DOB: [birth year] ?Today's Date: 12/22/2021 ? ?History of Present Illness ? Pt is 56 y/o male admitted and CT head with no acute intracranial finding however MRI showing small acute infarct of the dorsal medial right thalamus. ?  ?Clinical Impression ? Patient alert, agreeable to PT, denied pain. He reported at baseline he is independent and works. He was able to perform all mobility I-modI. Trialed ambulation with and without SPC due to pt staggering (deficits in vision, as well as decreased LLE coordination). Some improvement noted with SPC. Overall the patient demonstrated functional mobility, with major limitor of vision. Recommendation is for outpatient PT to address changes in balance.    ?   ? ?Recommendations for follow up therapy are one component of a multi-disciplinary discharge planning process, led by the attending physician.  Recommendations may be updated based on patient status, additional functional criteria and insurance authorization. ? ?Follow Up Recommendations Outpatient PT ? ?  ?Assistance Recommended at Discharge PRN  ?Patient can return home with the following ? Assist for transportation;Direct supervision/assist for medications management ? ?  ?Equipment Recommendations [place]  ?Recommendations for Other Services ?    ?  ?Functional Status Assessment Patient has had a recent decline in their functional status and demonstrates the ability to make significant improvements in function in a reasonable and predictable amount of time.  ? ?  ?Precautions / Restrictions Precautions ?Precautions: Fall ?Restrictions ?Weight Bearing Restrictions: No  ? ?  ? ?Mobility ? Bed Mobility ?Overal bed mobility: Modified Independent ?  ?  ?  ?  ?  ?  ?  ?  ? ?Transfers ?Overall transfer level: Modified independent ?  ?  ?  ?  ?  ?  ?  ?  ?  ?  ? ?Ambulation/Gait ?Ambulation/Gait assistance: Supervision ?Gait Distance (Feet):  40 Feet ?Assistive device: Straight cane, None ?  ?  ?  ?  ?  ? ?Stairs ?  ?  ?  ?  ?  ? ?Wheelchair Mobility ?  ? ?Modified Rankin (Stroke Patients Only) ?  ? ?  ? ?Balance Overall balance assessment: Needs assistance ?Sitting-balance support: Feet supported ?Sitting balance-Leahy Scale: Normal ?  ?  ?Standing balance support: During functional activity ?Standing balance-Leahy Scale: Good ?Standing balance comment: trialed with SPC ?  ?  ?  ?  ?  ?  ?  ?  ?  ?  ?  ?   ? ? ? ?Pertinent Vitals/Pain Pain Assessment ?Pain Assessment: No/denies pain  ? ? ?Home Living Family/patient expects to be discharged to:: Private residence ?Living Arrangements: Spouse/significant other;Children ?Available Help at Discharge: Family;Available PRN/intermittently ?Type of Home: House ?Home Access: Stairs to enter ?  ?Entrance Stairs-Number of Steps: 2 STE ?Alternate Level Stairs-Number of Steps: flight to basement where his bedroom is ?Home Layout: Multi-level ?Home Equipment: None ?   ?  ?Prior Function Prior Level of Function : Independent/Modified Independent ?  ?  ?  ?  ?  ?  ?  ?ADLs Comments: Pt reports working full time (3rd shift) and is independent in all aspects of care. ?  ? ? ?Hand Dominance  ? Dominant Hand: Right ? ?  ?Extremity/Trunk Assessment  ? Upper Extremity Assessment ?Upper Extremity Assessment: Overall WFL for tasks assessed ?  ? ?Lower Extremity Assessment ?Lower Extremity Assessment: RLE deficits/detail;LLE deficits/detail ?RLE Sensation: WNL ?RLE Coordination: WNL ?LLE Sensation: WNL ?LLE Coordination: decreased gross motor ?  ? ?Cervical /  Trunk Assessment ?Cervical / Trunk Assessment: Normal  ?Communication  ? Communication: No difficulties  ?Cognition Arousal/Alertness: Awake/alert ?Behavior During Therapy: [facility] for tasks assessed/performed ?Overall Cognitive Status: Within Functional Limits for tasks assessed ?  ?  ?  ?  ?  ?  ?  ?  ?  ?  ?  ?  ?  ?  ?  ?  ?  ?  ?  ? ?  ?General Comments   ? ?  ?Exercises     ? ?Assessment/Plan  ?  ?PT Assessment Patient needs continued PT services  ?PT Problem List Decreased balance;Decreased mobility;Decreased knowledge of use of DME ? ?   ?  ?PT Treatment Interventions DME instruction;Therapeutic exercise;Gait training;Balance training;Neuromuscular re-education;Stair training;Functional mobility training;Therapeutic activities;Patient/family education   ? ?PT Goals (Current goals can be found in the Care Plan section)  ?Acute Rehab PT Goals ?Patient Stated Goal: go home ?PT Goal Formulation: With patient ?Time For Goal Achievement: 01/05/22 ?Potential to Achieve Goals: Good ? ?  ?Frequency Min 2X/week ?  ? ? ?Co-evaluation   ?  ?  ?  ?  ? ? ?  ?AM-PAC PT "6 Clicks" Mobility  ?Outcome Measure Help needed turning from your back to your side while in a flat bed without using bedrails?: None ?Help needed moving from lying on your back to sitting on the side of a flat bed without using bedrails?: None ?Help needed moving to and from a bed to a chair (including a wheelchair)?: None ?Help needed standing up from a chair using your arms (e.g., wheelchair or bedside chair)?: None ?Help needed to walk in hospital room?: None ?Help needed climbing 3-5 steps with a railing? : None ?6 Click Score: 24 ? ?  ?End of Session   ?Activity Tolerance: Patient tolerated treatment well ?Patient left: in bed;with call bell/phone within reach ?Nurse Communication: Mobility status ?PT Visit Diagnosis: Other abnormalities of gait and mobility (R26.89);Difficulty in walking, not elsewhere classified (R26.2) ?  ? ?Time: 2841-3244 ?PT Time Calculation (min) (ACUTE ONLY): 12 min ? ? ?Charges:   PT Evaluation ?$PT Eval Low Complexity: 1 Low ?  ?  ?   ? ? ?Olga Coaster PT, DPT ?3:33 PM,12/22/21 ? ? ?

## 2021-12-22 NOTE — H&P (Addendum)
?History and Physical  ? ? ?PatientKenniel Walker INE:[id number] DOB: [birth year] ?DOA: 12/22/2021 ?DOS: the patient was seen and examined on 12/22/2021 ?PCP: [facility] [place]  ?Patient coming from: Home ? ?Chief Complaint:  ?Chief Complaint  ?Patient presents with  ? Dizziness  ? ? ?HPI: Roy Walker is a 56 y.o. male with medical history significant for Non-insulin-dependent type 2 diabetes and hypertension who presents to the ED for dizziness started in the a.m. on 4/16 described as a spinning feeling and double vision when both his eyes are open, but improves when he covers up 1.  He has no associated hearing loss, tinnitus or ear pain.  Had a previous similar episode that resolved on its own.  Since the onset of the symptoms he feels like he is leaning to the left when he walks. ?ED course and data review: BP 145/96 with otherwise normal vitals.  Blood work significant for blood glucose of 439, troponin 18.  EKG with sinus rhythm at 78 with no acute ST-T wave changes.  CT head with no acute intracranial finding however MRI showing small acute infarct of the dorsal medial right thalamus.  Patient was given a dose of meclizine and hospitalist consulted for admission.  ? ?Review of Systems: As mentioned in the history of present illness. All other systems reviewed and are negative. ?Past Medical History:  ?Diagnosis Date  ? Diabetes mellitus without complication ([place])   ? Hypertension   ? ?Past Surgical History:  ?Procedure Laterality Date  ? HERNIA REPAIR    ? UMBILICAL HERNIA REPAIR N/A 05/26/2018  ? Procedure: HERNIA REPAIR UMBILICAL ADULT;  Surgeon: Benjamine Sprague, DO;  Location: [facility] ORS;  Service: General;  Laterality: N/A;  ? ?Social History:  reports that he quit smoking about 16 years ago. His smoking use included cigarettes. He has never used smokeless tobacco. He reports current alcohol use of about 10.0 standard drinks per week. He reports that he does not use  drugs. ? ?No Known Allergies ? ?History reviewed. No pertinent family history. ? ?Prior to Admission medications   ?Medication Sig Start Date End Date Taking? Authorizing Provider  ?blood glucose meter kit and supplies KIT Dispense based on patient and insurance preference. Check blood sugar three times daily before meals. 10/19/16   [name], MD  ?dicloxacillin (DYNAPEN) 500 MG capsule Take 1 capsule (500 mg total) by mouth 4 (four) times daily. 09/16/20   [name], MD  ?losartan (COZAAR) 50 MG tablet Take 50 mg by mouth daily.    [provider]  ?metFORMIN (GLUCOPHAGE) 500 MG tablet Take 1 tablet (500 mg total) by mouth 2 (two) times daily with a meal. 10/19/16   [name], MD  ? ? ?Physical Exam: ?Vitals:  ? 12/21/21 1810 12/21/21 1830 12/22/21 0051 12/22/21 0100  ?BP: (!) 145/96  (!) 139/96 (!) 144/86  ?Pulse: 78  75 69  ?Resp: _0 ?Temp: 99.1 ?F (37.3 ?C)     ?TempSrc: Oral     ?SpO2: (!) 17%  97% 94%  ?Weight:  87.1 kg    ?Height:  5' 7" (1.702 m)    ? ?Physical Exam ?Vitals and nursing note reviewed.  ?Constitutional:   ?   General: He is not in acute distress. ?HENT:  ?   Head: Normocephalic and atraumatic.  ?Cardiovascular:  ?   Rate and Rhythm: Normal rate and regular rhythm.  ?   Pulses: Normal pulses.  ?   Heart sounds: Normal  heart sounds.  ?Pulmonary:  ?   Effort: Pulmonary effort is normal.  ?   Breath sounds: Normal breath sounds.  ?Abdominal:  ?   Palpations: Abdomen is soft.  ?   Tenderness: There is no abdominal tenderness.  ? ? ? ?Data Reviewed: ?Relevant notes from primary care and specialist visits, past discharge summaries as available in EHR, including Care Everywhere. ?Prior diagnostic testing as pertinent to current admission diagnoses ?Updated medications and problem lists for reconciliation ?ED course, including vitals, labs, imaging, treatment and response to treatment ?Triage notes, nursing and pharmacy notes and ED provider's notes ?Notable  results as noted in HPI ? ? ?Assessment and Plan: ?* Acute CVA (cerebrovascular accident) ([place]) ?Permissive hypertension for first 24-48 hrs post stroke onset: Prn Labetalol IV or Vasotec IV If BP greater than 220/120  ?Statins for LDL goal less than 70 ?ASA 325 x 1 then 77m daily ?Telemetry, echo and carotid Doppler ?Avoid dextrose containing fluids, Maintain euglycemia, euthermia ?Neuro checks q4 hrs x 24 hrs and then per shift ?Head of bed 30 degrees ?Physical therapy/Occupational therapy/Speech therapy if failed dysphagia screen ?Neurology consult to follow ? ? ?Uncontrolled type 2 diabetes mellitus with hyperglycemia, without long[place]term [place] use of insulin [place] ?IV insulin x1 dose then sliding scale coverage ? ?Hypertension ?Permissive hypertension ? ? ? ? ? ? ?Advance Care Planning:   Code Status: Full Code  ? ?Consults: none ? ?Family Communication: wife and brother at bedside ? ?Severity of Illness: ?The appropriate patient status for this patient is OBSERVATION. Observation status is judged to be reasonable and necessary in order to provide the required intensity of service to ensure the patient's safety. The patient's presenting symptoms, physical exam findings, and initial radiographic and laboratory data in the context of their medical condition is felt to place them at decreased risk for further clinical deterioration. Furthermore, it is anticipated that the patient will be medically stable for discharge from the hospital within 2 midnights of admission.  ? ?Author: ?HAthena Masse MD ?12/22/2021 2:48 AM ? ?For on call review [email or web address]  ?

## 2021-12-22 NOTE — Progress Notes (Signed)
Inpatient Diabetes Program Recommendations ? ?AACE/ADA: New Consensus Statement on Inpatient Glycemic Control (2015) ? ?Target Ranges:  Prepandial:   less than 140 mg/dL ?     Peak postprandial:   less than 180 mg/dL (1-2 hours) ?     Critically ill patients:  140 - 180 mg/dL  ? ?Lab Results  ?Component Value Date  ? GLUCAP 257 (H) 12/22/2021  ? HGBA1C 14.0 (H) 12/22/2021  ? ? ?Review of Glycemic Control ? Latest Reference Range & Units 12/22/21 02:18 12/22/21 04:19 12/22/21 07:22  ?Glucose-Capillary 70 - 99 mg/dL 950 (H) 932 (H) 671 (H)  ?(H): Data is abnormally high ? ?Diabetes history: DM2 ?Outpatient Diabetes medications: Metformin 500 mg bid ?Current orders for Inpatient glycemic control: Semglee 10 units, Novolog correction 0-15 units tid , 0-5 units hs ? ?Inpatient Diabetes Program Recommendations:   ?Noted patient was on Lantus 30 and Amaryl 8 mg qd listed on offfice visit 01/08/20. ?Please consider: ?-Increase Semglee to 16 units qd (.02 units/kg x 87.1 kg = 17 units) ?-Add Novolog 3 units tid meal coverage if eats 50% meal ?Will plan to speak with patient during hospitalization. ? ?Thank you, ?Roy Fischer Zebediah Beezley, RN, MSN, CDE  ?Diabetes Coordinator ?Inpatient Glycemic Control Team ?Team Pager [phone] (8am-5pm) ?12/22/2021 11:10 AM ? ? ? ? ?

## 2021-12-23 ENCOUNTER — Other Ambulatory Visit: Payer: Self-pay | Admitting: Internal Medicine

## 2021-12-23 DIAGNOSIS — I639 Cerebral infarction, unspecified: Secondary | ICD-10-CM | POA: Diagnosis not present

## 2021-12-23 LAB — GLUCOSE, CAPILLARY
Glucose-Capillary: 380 mg/dL — ABNORMAL HIGH (ref 70–99)
Glucose-Capillary: 294 mg/dL — ABNORMAL HIGH (ref 70–99)

## 2021-12-23 MED ORDER — LOSARTAN POTASSIUM 50 MG PO TABS: 50.0000 mg | ORAL_TABLET | Freq: Every day | ORAL | 3 refills | Status: DC

## 2021-12-23 MED ORDER — ATORVASTATIN CALCIUM 20 MG PO TABS: 80.0000 mg | ORAL_TABLET | Freq: Every day | ORAL | Status: DC

## 2021-12-23 MED ORDER — FENOFIBRATE 160 MG PO TABS: 160.0000 mg | ORAL_TABLET | Freq: Every day | ORAL | 3 refills | Status: DC

## 2021-12-23 MED ORDER — INSULIN GLARGINE-YFGN 100 UNIT/ML ~~LOC~~ SOLN: 16.0000 [IU] | Freq: Every day | SUBCUTANEOUS | 11 refills | Status: DC

## 2021-12-23 MED ORDER — ASPIRIN EC 81 MG PO TBEC: 81.0000 mg | DELAYED_RELEASE_TABLET | Freq: Every day | ORAL | Status: DC

## 2021-12-23 MED ORDER — CLOPIDOGREL BISULFATE 75 MG PO TABS: 75.0000 mg | ORAL_TABLET | Freq: Every day | ORAL | Status: DC

## 2021-12-23 MED ORDER — ENOXAPARIN SODIUM 40 MG/0.4ML IJ SOSY
40.0000 mg | PREFILLED_SYRINGE | INTRAMUSCULAR | Status: DC
  Administered 2021-12-23: 40 mg via SUBCUTANEOUS
  Filled 2021-12-23: qty 0.4

## 2021-12-23 MED ORDER — INSULIN ASPART 100 UNIT/ML IJ SOLN: 3.0000 [IU] | Freq: Three times a day (TID) | INTRAMUSCULAR | 11 refills | Status: DC

## 2021-12-23 MED ORDER — INSULIN ASPART 100 UNIT/ML IJ SOLN
3.0000 [IU] | Freq: Three times a day (TID) | INTRAMUSCULAR | Status: DC
  Administered 2021-12-23: 3 [IU] via SUBCUTANEOUS
  Filled 2021-12-23: qty 1

## 2021-12-23 MED ORDER — ASPIRIN 81 MG PO TBEC: 81.0000 mg | DELAYED_RELEASE_TABLET | Freq: Every day | ORAL | 11 refills | Status: DC

## 2021-12-23 MED ORDER — INSULIN GLARGINE-YFGN 100 UNIT/ML ~~LOC~~ SOLN
16.0000 [IU] | Freq: Every day | SUBCUTANEOUS | Status: DC
  Administered 2021-12-23: 16 [IU] via SUBCUTANEOUS
  Filled 2021-12-23: qty 0.16

## 2021-12-23 MED ORDER — CLOPIDOGREL BISULFATE 75 MG PO TABS: 75.0000 mg | ORAL_TABLET | Freq: Every day | ORAL | 3 refills | Status: DC

## 2021-12-23 MED ORDER — ATORVASTATIN CALCIUM 80 MG PO TABS
80.0000 mg | ORAL_TABLET | Freq: Every day | ORAL | 3 refills | Status: DC
Start: 2021-12-24 — End: 2024-01-16

## 2021-12-23 NOTE — Discharge Summary (Addendum)
Physician Discharge Summary  Roy Walker ZOX:[id number] DOB: Oct 15, 1965 DOA: 12/22/2021  PCP: [facility]  Admit date: 12/22/2021 Discharge date: 12/23/2021  Admitted From: Home Disposition:  Home  Recommendations for Outpatient Follow-up:  Follow up with PCP in 1-2 weeks Follow up with Neurology in 2-4 weeks Please follow up on the following pending results:  Home Health: No Equipment/Devices:Cane  Discharge Condition:Stable CODE STATUS:Full Diet recommendation: Heart Healthy, carb modified  Brief/Interim Summary:  56 year old man with PMH of DM2, HTN who presented with dizziness and was found to have small acute infarct in the dorsal medial right thalamus.  Neurology was consulted.  MRI/MRA done and showed stenosis of multiple branches.  CTA done which showed severe stenosis of right MCA, M1 segment.  No intervention given asymptomatic.  Further symptoms included double vision, need for ophthalmology evaluation.   Discharge Diagnoses:  Acute CVA (cerebrovascular accident) Right MCA stenosis During stay, patient's dizziness and weakness improved.  He had mild dysmetria of the left hand.  He had persistent double vision.  He had MRI/MRA done showing dorsal medial right thalamus stroke.  He had CTA of the head done which showed severe stenosis of the M1 branch of right MCA.  Neurology, Dr. Otelia Limes, discussed with IR and they noted no needed intervention without symptoms.  He had carotid ultrasound done which showed no significant stenosis of the arteries. Finally, had TTE done which showed grade 2 diastolic dysfunction, normal systolic function.   He was started on daily aspirin and plavix.  He was started on atorvastatin 80mg  daily along with fenofibrate.    Therapy (OT/PT) recommended outpatient therapies.    HLD, Hypertriglyceridemia He was started on atorvastatin 80mg  daily along with fenofibrate.    Hypertension His systolic blood pressure  goal, given stenosis, is 120 - 140.  His amlodipine was held at discharge and his losartan was halved to 50mg  daily.     Uncontrolled type 2 diabetes mellitus with hyperglycemia, without long-term current use of insulin (HCC) A1C was 14.0.  He was started on semglee long acting insulin at 16 units.  He was started on novolog insulin at 3 units with meals.  He was continued on jardiance and metformin.  Glimepiride was discontinued.   Diplopia Recommended outpatient ophthalmology evaluation.     Discharge Instructions  Discharge Instructions     Call MD for:  difficulty breathing, headache or visual disturbances   Complete by: As directed    Call MD for:  extreme fatigue   Complete by: As directed    Call MD for:  hives   Complete by: As directed    Call MD for:  persistant dizziness or light-headedness   Complete by: As directed    Call MD for:  persistant nausea and vomiting   Complete by: As directed    Call MD for:  severe uncontrolled pain   Complete by: As directed    Call MD for:  temperature >100.4   Complete by: As directed    Diet - low sodium heart healthy   Complete by: As directed    Diet Carb Modified   Complete by: As directed    Discharge instructions   Complete by: As directed    Roy Walker - -  Your medications have changed.   Please stop your glimepiride and amlodipine.   Please DECREASE your losartan to 50mg  (half of your current tablet) and I sent you in a new prescription as well.   You will  start taking insulin, both long acting (Semglee, lantus) and short acting with meals.   Make and appointment with your primary doctor in the near future to go over your medications.    You will START some new medications including a daily aspirin, atorvastatin, fenofibrate and plavix.  These will help prevent you from having another stroke.    It is important with your double vision and your dizziness that you take some time off of work until this improves.   You should also not drive until your double vision improves and/or you are told you can by your primary doctor.   Activity: As tolerated with Full fall precautions use walker/cane & assistance as needed  Disposition Home   Diet: Heart Healthy   Diet Order           Diet heart healthy/carb modified Room service appropriate? Yes; Fluid consistency: Thin  Diet effective now               Special Instructions: If you have smoked or chewed Tobacco  in the last 2 yrs please stop smoking, stop any regular Alcohol  and or any Recreational drug use.  On your next visit with your primary care physician please Get Medicines reviewed and adjusted.  Please request your Prim.MD to go over all Hospital Tests and Procedure/Radiological results at the follow up, please get all Hospital records sent to your Prim MD by signing hospital release before you go home.  If you experience worsening of your admission symptoms, develop shortness of breath, life threatening emergency, suicidal or homicidal thoughts you must seek medical attention immediately by calling 911 or calling your MD immediately  if symptoms less severe.  You Must read complete instructions/literature along with all the possible adverse reactions/side effects for all the Medicines you take and that have been prescribed to you. Take any new Medicines after you have completely understood and accpet all the possible adverse reactions/side effects.   Do not drive, operate heavy machinery, perform activities at heights, swimming or participation in water activities or provide baby sitting services until you are told you can by your neurologist or PCP.   Do not drive when taking Pain medications.  Do not take more than prescribed Pain, Sleep and Anxiety Medications  Wear Seat belts while driving.   Please note  You were cared for by a hospitalist during your hospital stay. If you have any questions about your discharge medications or  the care you received while you were in the hospital after you are discharged, you can call the unit and asked to speak with the hospitalist on call if the hospitalist that took care of you is not available. Once you are discharged, your primary care physician will handle any further medical issues. Please note that NO REFILLS for any discharge medications will be authorized once you are discharged, as it is imperative that you return to your primary care physician (or establish a relationship with a primary care physician if you do not have one) for your aftercare needs so that they can reassess your need for medications and monitor your lab values.   Increase activity slowly   Complete by: As directed       Allergies as of 12/23/2021   No Known Allergies      Medication List     STOP taking these medications    amLODipine 5 MG tablet Commonly known as: NORVASC   dicloxacillin 500 MG capsule Commonly known as: DYNAPEN  glimepiride 2 MG tablet Commonly known as: AMARYL       TAKE these medications    aspirin 81 MG EC tablet Take 1 tablet (81 mg total) by mouth daily. Swallow whole. Start taking on: December 24, 2021   atorvastatin 80 MG tablet Commonly known as: LIPITOR Take 1 tablet (80 mg total) by mouth daily. Start taking on: December 24, 2021   blood glucose meter kit and supplies Kit Dispense based on patient and insurance preference. Check blood sugar three times daily before meals.   clopidogrel 75 MG tablet Commonly known as: PLAVIX Take 1 tablet (75 mg total) by mouth daily.   cyclobenzaprine 5 MG tablet Commonly known as: FLEXERIL Take 5 mg by mouth 3 (three) times daily as needed.   fenofibrate 160 MG tablet Take 1 tablet (160 mg total) by mouth daily. Start taking on: December 24, 2021   insulin aspart 100 UNIT/ML injection Commonly known as: novoLOG Inject 3 Units into the skin 3 (three) times daily with meals.   insulin glargine-yfgn 100 UNIT/ML  injection Commonly known as: SEMGLEE Inject 0.16 mLs (16 Units total) into the skin daily. Start taking on: December 24, 2021   Jardiance 10 MG Tabs tablet Generic drug: empagliflozin Take 10 mg by mouth daily.   losartan 50 MG tablet Commonly known as: COZAAR Take 1 tablet (50 mg total) by mouth daily. What changed:  medication strength how much to take   metFORMIN 1000 MG tablet Commonly known as: GLUCOPHAGE Take 1,000 mg by mouth 2 (two) times daily. What changed: Another medication with the same name was removed. Continue taking this medication, and follow the directions you see here.   Valtrex 1000 MG tablet Generic drug: valACYclovir Take by mouth 2 (two) times daily.        Follow-up Information     [facility]. Schedule an appointment as soon as possible for a visit in 1 day(s).   Specialty: General Practice Contact information: [address] [place] [address] [phone]         [facility]. Call in 1 day(s).   Contact information: [address] [place] [address] [phone]               No Known Allergies  Consultations: Neurology, Dr. Otelia Limes   Procedures/Studies: CT HEAD WO CONTRAST ( )  Result Date: 12/21/2021 CLINICAL DATA:  Dizziness EXAM: CT HEAD WITHOUT CONTRAST TECHNIQUE: Contiguous axial images were obtained from the base of the skull through the vertex without intravenous contrast. RADIATION DOSE REDUCTION: This exam was performed according to the departmental dose-optimization program which includes automated exposure control, adjustment of the mA and/or kV according to patient size and/or use of iterative reconstruction technique. COMPARISON:  None. FINDINGS: Brain: No evidence of acute infarction, hemorrhage, hydrocephalus, extra-axial collection or mass lesion/mass effect. Scattered low-density changes within the periventricular and  subcortical white matter compatible with chronic microvascular ischemic change. Vascular: Atherosclerotic calcifications involving the large vessels of the skull base. No unexpected hyperdense vessel. Skull: Normal. Negative for fracture or focal lesion. Sinuses/Orbits: No acute finding. Other: None. IMPRESSION: 1. No acute intracranial abnormality. 2. Mild chronic microvascular ischemic change. Electronically Signed   By: Duanne Guess D.O.   On: 12/21/2021 18:43   MR ANGIO HEAD WO CONTRAST  Result Date: 12/22/2021 CLINICAL DATA:  Acute thalamic infarct on MRI, dizziness EXAM: MRA HEAD WITHOUT CONTRAST TECHNIQUE: Angiographic images of the Circle of Willis were acquired  using MRA technique without intravenous contrast. COMPARISON:  MRI 12/22/2021, no prior MRA FINDINGS: Anterior circulation: Both internal carotid arteries are patent to the termini, without significant stenosis. A1 segments patent, with mild narrowing of the right A1 versus hypoplasia. 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 Perfusion of the distal ACA branches. No M1 stenosis or occlusion. Normal MCA bifurcations. Focal occlusion of both MCA branches just distal to the bifurcation. (Series 5, image 122), with reconstitution approximately 5 mm downstream (series 5, image 131) in the M2 branches. Addition there appears to be severe focal stenosis in the more posterior right M2 branch (series 5, image 130) and poor signal in the more anterior right M2 branch and its subsequent branches (series 5, image 121, 131 and 139). The distal right MCA branches demonstrate matter signal. Normal left M2 and more distal MCA branches. Posterior circulation: Vertebral arteries patent to the vertebrobasilar junction without stenosis. Posterior inferior cerebral arteries patent bilaterally. Basilar patent to its distal aspect. Superior  cerebellar arteries patent bilaterally. Patent P1 segments with patent posterior communicating arteries bilaterally. PCAs perfused to their distal aspects without stenosis. Anatomic variants: None significant Other: None. IMPRESSION: 1. Focal occlusion of both M2 branches just distal to the bifurcation, with reconstitution approximately 5 mm stone stream in the M2 branches, although there appears to be additional severe focal stenosis in the more posterior right M2 branch and poor signal in the more anterior right M2 and M3 branches. 2. Irregularity of the A2 segment, which may be azygous, with a focal area of severe stenosis in the A2 segment. Subsequent contour abnormality in the ACA may represent post stenotic dilatation. This could be further evaluated with a CTA if clinically indicated. These results will be called to the ordering clinician or representative by the Radiologist Assistant, and communication documented in the PACS or [employer]. Electronically Signed   By: Wiliam Ke M.D.   On: 12/22/2021 18:32   MR BRAIN WO CONTRAST  Result Date: 12/22/2021 CLINICAL DATA:  Dizziness with left-sided drift EXAM: MRI HEAD WITHOUT CONTRAST TECHNIQUE: Multiplanar, multiecho pulse sequences of the brain and surrounding structures were obtained without intravenous contrast. COMPARISON:  None. 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 No acute or chronic hemorrhage. There is multifocal hyperintense T2-weighted signal within the white matter. Parenchymal volume and CSF spaces are normal. The midline structures are normal. Vascular: Major flow voids are preserved. Skull and upper cervical spine: Normal calvarium and skull base. Visualized upper cervical spine and soft tissues are normal. Sinuses/Orbits:No paranasal sinus fluid levels or advanced mucosal thickening. No mastoid or middle ear effusion. Normal orbits. IMPRESSION: 1. Small acute infarct of the dorsomedial right  thalamus. No hemorrhage or mass effect. 2. White matter changes most commonly indicating chronic small vessel ischemia. Electronically Signed   By: Deatra Robinson M.D.   On: 12/22/2021 01:49   US Carotid Bilateral (at [facility] and [facility] only)  Result Date: 12/22/2021 CLINICAL DATA:  Diabetes and hypertension.  [id number]. EXAM: BILATERAL CAROTID DUPLEX ULTRASOUND TECHNIQUE: Wallace Cullens scale imaging, color Doppler and duplex ultrasound were performed of bilateral carotid and vertebral arteries in the neck. COMPARISON:  Soft tissue neck CT with contrast dated 09/10/2020 FINDINGS: Criteria: Quantification of carotid stenosis is based on velocity parameters that correlate the residual internal carotid diameter with NASCET-based stenosis levels, using the diameter of the distal internal carotid  lumen as the denominator for stenosis measurement. The following velocity measurements were obtained: RIGHT ICA: 71 cm/sec CCA: 72 cm/sec SYSTOLIC ICA/CCA RATIO:  1.0 ECA: 98 cm/sec LEFT ICA: 62 cm/sec CCA: 100 cm/sec SYSTOLIC ICA/CCA RATIO:  0.62 ECA: 60 cm/sec RIGHT CAROTID ARTERY: Flow is antegrade. There is mild intimal thickening without focal plaques in the common carotid artery. There are small amounts of scattered non stenosing heterogeneous plaque in the carotid bulb and proximal ICA. RIGHT VERTEBRAL ARTERY:  Flow is well preserved and antegrade. LEFT CAROTID ARTERY: There is mild intimal thickening in the common carotid artery without focal plaques. There is a small amount of scattered non stenosing heterogeneous plaque in the carotid bulb. LEFT VERTEBRAL ARTERY:  Flow is well preserved and antegrade. Upper extremity blood pressures: This information was not provided by the technologist. Other: 2 adjacent mildly prominent nonspecific anterior cervical lymph nodes left neck. There is preservation of the fatty hila. IMPRESSION: 1. No hemodynamically significant stenosis of either carotid artery by ultrasound criteria. 2. Small amounts  of nonstenosing heterogeneous plaque. 3. Nonspecific left anterior cervical lymph nodes. Electronically Signed   By: Almira Bar M.D.   On: 12/22/2021 05:19   ECHOCARDIOGRAM COMPLETE  Result Date: 12/22/2021    ECHOCARDIOGRAM REPORT   [facility] Date of Exam: 12/22/2021 Medical Rec #:  [id number]             Height:       67.0 in Accession #:    [id number]            Weight:       192.0 lb Date of Birth:  12-24-1965             BSA:          1.987 m Patient Age:    55 years              BP:           148/109 mmHg Patient Gender: M                     HR:           72 bpm. Exam Location:  [facility] Procedure: 2D Echo, Color Doppler and Cardiac Doppler Indications:     I63.9 Stroke  History:         Patient has no prior history of Echocardiogram examinations.                  Risk Factors:Hypertension, Diabetes and Former Smoker.  Sonographer:     Ceasar Mons Referring Phys:  [id number] Andris Baumann Diagnosing Phys: Yvonne Kendall MD  Sonographer Comments: Suboptimal parasternal window. Image acquisition challenging due to respiratory motion. IMPRESSIONS  1. Left ventricular ejection fraction, by estimation, is 65 to 70%. The left ventricle has normal function. Left ventricular endocardial border not optimally defined to evaluate regional wall motion. There is moderate left ventricular hypertrophy. Left ventricular diastolic parameters are consistent with Grade II diastolic dysfunction (pseudonormalization).  2. Right ventricular systolic function is normal. The right ventricular size is normal. Tricuspid regurgitation signal is inadequate for assessing PA pressure.  3. The mitral valve was not well visualized. Trivial mitral valve regurgitation. No evidence of mitral stenosis.  4. The aortic valve was not well visualized. Aortic valve regurgitation is not visualized. No aortic stenosis is present.  5. The inferior vena cava is dilated in size with <50% respiratory variability, suggesting  right atrial pressure  of 15 mmHg. FINDINGS  Left Ventricle: Left ventricular ejection fraction, by estimation, is 65 to 70%. The left ventricle has normal function. Left ventricular endocardial border not optimally defined to evaluate regional wall motion. The left ventricular internal cavity size was normal in size. There is moderate left ventricular hypertrophy. Left ventricular diastolic parameters are consistent with Grade II diastolic dysfunction (pseudonormalization). Right Ventricle: The right ventricular size is normal. Right vetricular wall thickness was not well visualized. Right ventricular systolic function is normal. Tricuspid regurgitation signal is inadequate for assessing PA pressure. Left Atrium: Left atrial size was normal in size. Right Atrium: Right atrial size was normal in size. Pericardium: There is no evidence of pericardial effusion. Mitral Valve: The mitral valve was not well visualized. Mild mitral annular calcification. Trivial mitral valve regurgitation. No evidence of mitral valve stenosis. MV peak gradient, 5.7 mmHg. The mean mitral valve gradient is 1.0 mmHg. Tricuspid Valve: The tricuspid valve is grossly normal. Tricuspid valve regurgitation is trivial. Aortic Valve: The aortic valve was not well visualized. Aortic valve regurgitation is not visualized. No aortic stenosis is present. Aortic valve mean gradient measures 4.0 mmHg. Aortic valve peak gradient measures 6.6 mmHg. Aortic valve area, by VTI measures 2.69 cm. Pulmonic Valve: The pulmonic valve was not well visualized. Pulmonic valve regurgitation is not visualized. No evidence of pulmonic stenosis. Aorta: The aortic root is normal in size and structure. Pulmonary Artery: The pulmonary artery is not well seen. Venous: The inferior vena cava is dilated in size with less than 50% respiratory variability, suggesting right atrial pressure of 15 mmHg. IAS/Shunts: The interatrial septum was not well visualized.  LEFT VENTRICLE  PLAX 2D LVIDd:         3.19 cm   Diastology LVIDs:         2.06 cm   LV e' medial:    6.96 cm/s LV PW:         1.41 cm   LV E/e' medial:  11.3 LV IVS:        1.51 cm   LV e' lateral:   9.79 cm/s LVOT diam:     2.00 cm   LV E/e' lateral: 8.0 LV SV:         71 LV SV Index:   36 LVOT Area:     3.14 cm  RIGHT VENTRICLE RV Basal diam:  2.60 cm RV S prime:     14.70 cm/s LEFT ATRIUM             Index        RIGHT ATRIUM           Index LA diam:        4.00 cm 2.01 cm/m   RA Area:     13.10 cm LA Vol (A2C):   42.4 ml 21.33 ml/m  RA Volume:   26.40 ml  13.28 ml/m LA Vol (A4C):   39.7 ml 19.98 ml/m LA Biplane Vol: 43.4 ml 21.84 ml/m  AORTIC VALVE                    PULMONIC VALVE AV Area (Vmax):    2.28 cm     PV Vmax:       0.93 m/s AV Area (Vmean):   2.31 cm     PV Vmean:      65.800 cm/s AV Area (VTI):     2.69 cm     PV VTI:        0.181 m  AV Vmax:           128.00 cm/s  PV Peak grad:  3.5 mmHg AV Vmean:          92.700 cm/s  PV Mean grad:  2.0 mmHg AV VTI:            0.265 m AV Peak Grad:      6.6 mmHg AV Mean Grad:      4.0 mmHg LVOT Vmax:         92.90 cm/s LVOT Vmean:        68.100 cm/s LVOT VTI:          0.227 m LVOT/AV VTI ratio: 0.86  AORTA Ao Root diam: 3.50 cm MITRAL VALVE MV Area (PHT): 4.83 cm    SHUNTS MV Area VTI:   2.66 cm    Systemic VTI:  0.23 m MV Peak grad:  5.7 mmHg    Systemic Diam: 2.00 cm MV Mean grad:  1.0 mmHg MV Vmax:       1.19 m/s MV Vmean:      53.8 cm/s MV Decel Time: 157 msec MV E velocity: 78.40 cm/s MV A velocity: 62.60 cm/s MV E/A ratio:  1.25 Yvonne Kendall MD Electronically signed by Yvonne Kendall MD Signature Date/Time: 12/22/2021/7:19:18 PM    Final    CT ANGIO HEAD NECK W WO CM W PERF (CODE STROKE)  Result Date: 12/22/2021 CLINICAL DATA:  Dizziness and double vision EXAM: CT ANGIOGRAPHY HEAD AND NECK CT PERFUSION BRAIN TECHNIQUE: Multidetector CT imaging of the head and neck was performed using the standard protocol during bolus administration of intravenous  contrast. Multiplanar CT image reconstructions and MIPs were obtained to evaluate the vascular anatomy. Carotid stenosis measurements (when applicable) are obtained utilizing NASCET criteria, using the distal internal carotid diameter as the denominator. Multiphase CT imaging of the brain was performed following IV bolus contrast injection. Subsequent parametric perfusion maps were calculated using RAPID software. RADIATION DOSE REDUCTION: This exam was performed according to the departmental dose-optimization program which includes automated exposure control, adjustment of the mA and/or kV according to patient size and/or use of iterative reconstruction technique. CONTRAST:  OMNIPAQUE IOHEXOL 350 MG/ML SOLN COMPARISON:  Head CT 12/21/2021 Brain MRA 12/22/2021 FINDINGS: CT HEAD FINDINGS Brain: There is no mass, hemorrhage or extra-axial collection. The size and configuration of the ventricles and extra-axial CSF spaces are normal. There is no acute or chronic infarction. The brain parenchyma is normal. Skull: The visualized skull base, calvarium and extracranial soft tissues are normal. Sinuses/Orbits: No fluid levels or advanced mucosal thickening of the visualized paranasal sinuses. No mastoid or middle ear effusion. The orbits are normal. CTA NECK FINDINGS SKELETON: There is no bony spinal canal stenosis. No lytic or blastic lesion. OTHER NECK: Normal pharynx, larynx and major salivary glands. No cervical lymphadenopathy. Unremarkable thyroid gland. UPPER CHEST: No pneumothorax or pleural effusion. No nodules or masses. AORTIC ARCH: There is calcific atherosclerosis of the aortic arch. There is no aneurysm, dissection or hemodynamically significant stenosis of the visualized portion of the aorta. Conventional 3 vessel aortic branching pattern. The visualized proximal subclavian arteries are widely patent. RIGHT CAROTID SYSTEM: Normal without aneurysm, dissection or stenosis. LEFT CAROTID SYSTEM: Normal  without aneurysm, dissection or stenosis. VERTEBRAL ARTERIES: Left dominant configuration. Both origins are clearly patent. There is no dissection, occlusion or flow-limiting stenosis to the skull base (V1-V3 segments). CTA HEAD FINDINGS POSTERIOR CIRCULATION: --Vertebral arteries: Normal V4 segments. --Inferior cerebellar arteries: Normal. --Basilar artery: Normal. --Superior  cerebellar arteries: Normal. --Posterior cerebral arteries (PCA): Normal. ANTERIOR CIRCULATION: --Intracranial internal carotid arteries: Atherosclerotic calcification of the internal carotid arteries at the skull base without hemodynamically significant stenosis. --Anterior cerebral arteries (ACA): 3 mm fusiform dilatation of the azygos A2 segment. --Middle cerebral arteries (MCA): There is severe, near occlusive stenosis of the distal right M1 segment. VENOUS SINUSES: As permitted by contrast timing, patent. ANATOMIC VARIANTS: None Review of the MIP images confirms the above findings. CT Brain Perfusion Findings: ASPECTS: 10 at 6:24 p.m. on 12/21/2021 CBF (<30%) Volume: 0mL Perfusion (Tmax>6.0s) volume: 0mL Mismatch Volume: 0mL Infarction Location:None IMPRESSION: 1. No intracranial hemorrhage. ASPECTS is 10. 2. 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 3. 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 Aortic Atherosclerosis (ICD10-I70.0). Electronically Signed   By: Deatra Robinson M.D.   On: 12/22/2021 22:04      Subjective:  Patient was doing well on day of discharge, no complaints.  He noted continued double vision and we ask that he speak with his PCP about ophthalmology evaluation given his severe uncontrolled DM.    Discharge Exam: Vitals:   12/23/21 0816 12/23/21 1119  BP: (!) 138/97 (!) 159/98  Pulse: 70 69  Resp:  16  Temp:  98.6 F (37 C)  SpO2:  98%   Vitals:   12/23/21 0442 12/23/21 0815 12/23/21 0816 12/23/21 1119  BP: (!) 144/106 (!) 139/119 (!) 138/97 (!) 159/98  Pulse: 66  73 70 69  Resp: 16 17  16   Temp: 98.1 F (36.7 C) 98 F (36.7 C)  98.6 F (37 C)  TempSrc:      SpO2: 97% 99%  98%  Weight:      Height:        General: Pt is alert, awake, not in acute distress Cardiovascular: RR,NR, no murmur Respiratory: CTA bilaterally, no wheezing, no rhonchi Abdominal: Soft, NT, ND, +BS Extremities: no edema, no cyanosis noted on extremities Neuro: Mild dysmetria of the left upper extremity with finger to nose.  Otherwise, CN were intact, clear speech, strength 5/5 throughout.  Gait not tested.     The results of significant diagnostics from this hospitalization (including imaging, microbiology, ancillary and laboratory) are listed below for reference.     Microbiology: No results found for this or any previous visit (from the past 240 hour(s)).   Labs: BNP (last 3 results) No results for input(s): BNP in the last 8760 hours. Basic Metabolic Panel: Recent Labs  Lab 12/21/21 1812 12/22/21 0242  NA 133*  --   K 4.1  --   CL 98  --   CO2 24  --   GLUCOSE 439*  --   BUN 16  --   CREATININE 0.65 0.48*  CALCIUM 9.1  --    Liver Function Tests: Recent Labs  Lab 12/21/21 1812  AST 36  ALT 51*  ALKPHOS 103  BILITOT 1.0  PROT 6.7  ALBUMIN 3.7   No results for input(s): LIPASE, AMYLASE in the last 168 hours. No results for input(s): AMMONIA in the last 168 hours. CBC: Recent Labs  Lab 12/21/21 1812 12/22/21 0242  WBC 6.5 7.1  NEUTROABS 2.7  --   HGB 15.9 15.0  HCT 46.9 44.9  MCV 93.6 95.1  PLT 207 197   Cardiac Enzymes: No results for input(s): CKTOTAL, CKMB, CKMBINDEX, TROPONINI in the last 168 hours. BNP: Invalid input(s): POCBNP CBG: Recent Labs  Lab 12/22/21 1240 12/22/21 1652 12/22/21 2046 12/23/21 7628  12/23/21 1120  GLUCAP 335* 170* 377* 380* 294*   D-Dimer No results for input(s): DDIMER in the last 72 hours. Hgb A1c Recent Labs    12/22/21 0242  HGBA1C 14.0*   Lipid Profile Recent Labs    12/22/21 0247   CHOL 296*  HDL 33*  LDLCALC UNABLE TO CALCULATE IF TRIGLYCERIDE OVER 400 mg/dL  TRIG 161*  CHOLHDL 9.0  LDLDIRECT 139.6*   Thyroid function studies No results for input(s): TSH, T4TOTAL, T3FREE, THYROIDAB in the last 72 hours.  Invalid input(s): FREET3 Anemia work up No results for input(s): VITAMINB12, FOLATE, FERRITIN, TIBC, IRON, RETICCTPCT in the last 72 hours. Urinalysis    Component Value Date/Time   COLORURINE STRAW (A) 12/22/2021 0245   APPEARANCEUR CLEAR (A) 12/22/2021 0245   LABSPEC 1.029 12/22/2021 0245   PHURINE 5.0 12/22/2021 0245   GLUCOSEU >=500 (A) 12/22/2021 0245   HGBUR NEGATIVE 12/22/2021 0245   BILIRUBINUR NEGATIVE 12/22/2021 0245   KETONESUR NEGATIVE 12/22/2021 0245   PROTEINUR NEGATIVE 12/22/2021 0245   NITRITE NEGATIVE 12/22/2021 0245   LEUKOCYTESUR NEGATIVE 12/22/2021 0245   Sepsis Labs Invalid input(s): PROCALCITONIN,  WBC,  LACTICIDVEN Microbiology No results found for this or any previous visit (from the past 240 hour(s)).   Time coordinating discharge: Over 30 minutes  SIGNED:   Debe Coder, MD  [facility] 12/23/2021, 2:12 PM   If 7PM-7AM, please contact night-coverage [email or web address] Password [email or web address]

## 2021-12-23 NOTE — Discharge Instructions (Addendum)
[name] - - ? ?Your medications have changed.  ? ?Please stop your glimepiride and amlodipine.  ? ?Please DECREASE your losartan to 50mg  (half of your current tablet) and I sent you in a new prescription as well.  ? ?You will start taking insulin, both long acting (Semglee, lantus) and short acting with meals.  ? ?Make and appointment with your primary doctor in the near future to go over your medications.   ? ?You will START some new medications including a daily aspirin, atorvastatin, fenofibrate and plavix.  These will help prevent you from having another stroke.   ? ?It is important with your double vision and your dizziness that you take some time off of work until this improves.  You should also not drive until your double vision improves and/or you are told you can by your primary doctor.  ? ?Activity: As tolerated with Full fall precautions use walker/cane & assistance as needed ? ?Disposition Home  ? ?Diet: Heart Healthy  ? ?Diet Order   ? ?       ?  Diet heart healthy/carb modified Room service appropriate? Yes; Fluid consistency: Thin  Diet effective now       ?  ? ?  ?  ? ?  ?  ? ?Special Instructions: If you have smoked or chewed Tobacco  in the last 2 yrs please stop smoking, stop any regular Alcohol  and or any Recreational drug use. ? ?On your next visit with your primary care physician please Get Medicines reviewed and adjusted. ? ?Please request your Prim.MD to go over all Hospital Tests and Procedure/Radiological results at the follow up, please get all Hospital records sent to your Prim MD by signing hospital release before you go home. ? ?If you experience worsening of your admission symptoms, develop shortness of breath, life threatening emergency, suicidal or homicidal thoughts you must seek medical attention immediately by calling 911 or calling your MD immediately  if symptoms less severe. ? ?You Must read complete instructions/literature along with all the possible adverse  reactions/side effects for all the Medicines you take and that have been prescribed to you. Take any new Medicines after you have completely understood and accpet all the possible adverse reactions/side effects.  ? ?Do not drive, operate heavy machinery, perform activities at heights, swimming or participation in water activities or provide baby sitting services until you are told you can by your neurologist or PCP.  ? ?Do not drive when taking Pain medications.  Do not take more than prescribed Pain, Sleep and Anxiety Medications ? ?Wear Seat belts while driving. ? ? ?Please note ? ?You were cared for by a hospitalist during your hospital stay. If you have any questions about your discharge medications or the care you received while you were in the hospital after you are discharged, you can call the unit and asked to speak with the hospitalist on call if the hospitalist that took care of you is not available. Once you are discharged, your primary care physician will handle any further medical issues. Please note that NO REFILLS for any discharge medications will be authorized once you are discharged, as it is imperative that you return to your primary care physician (or establish a relationship with a primary care physician if you do not have one) for your aftercare needs so that they can reassess your need for medications and monitor your lab values. ? ?

## 2021-12-23 NOTE — TOC Initial Note (Signed)
Transition of Care (TOC) - Initial/Assessment Note  ? ? ?Patient Details  ?Name: Roy Walker ?MRN: [id number] ?Date of Birth: 1965-09-14 ? ?Transition of Care (TOC) CM/SW Contact:    ?Caryn Section, RN ?Phone Number: ?12/23/2021, 2:36 PM ? ?Clinical Narrative:     Patient has no concerns for TOC at this time.  Wife is home with him and assists as needed.  No transportation or medication concerns. ? ?Patient has a cane and is comfortable using it at this time. ?Patient and spouse state he is amenable to outpatient Physical Therapy at [facility].  Order sent to outpatient department.   ? ?            ? ? ?Expected Discharge Plan: OP Rehab ?Barriers to Discharge: Barriers Resolved ? ? ?Patient Goals and CMS Choice ?  ?  ?  ? ?Expected Discharge Plan and Services ?Expected Discharge Plan: OP Rehab ?  ?Discharge Planning Services: CM Consult ?Post Acute Care Choice: Durable Medical Equipment (Patient has a cane, as recommended by PT) ?Living arrangements for the past 2 months: Single Family Home ?Expected Discharge Date: 12/23/21               ?  ?  ?  ?  ?  ?  ?  ?  ?  ?  ? ?Prior Living Arrangements/Services ?Living arrangements for the past 2 months: Single Family Home ?Lives with:: Self, Spouse ?Patient language and need for interpreter reviewed:: Yes (No interpreter required) ?Do you feel safe going back to the place where you live?: Yes      ?Need for Family Participation in Patient Care: Yes (Comment) ?Care giver support system in place?: Yes (comment) ?  ?Criminal Activity/Legal Involvement Pertinent to Current Situation/Hospitalization: No - Comment as needed ? ?Activities of Daily Living ?Home Assistive Devices/Equipment: Cane (specify quad or straight) ?ADL Screening (condition at time of admission) ?Patient's cognitive ability adequate to safely complete daily activities?: Yes ?Is the patient deaf or have difficulty hearing?: No ?Does the patient have difficulty seeing, even when wearing glasses/contacts?:  Yes ?Does the patient have difficulty concentrating, remembering, or making decisions?: No ?Patient able to express need for assistance with ADLs?: Yes ?Does the patient have difficulty dressing or bathing?: No ?Independently performs ADLs?: Yes (appropriate for developmental age) ?Does the patient have difficulty walking or climbing stairs?: Yes ?Weakness of Legs: Left ?Weakness of Arms/Hands: Left ? ?Permission Sought/Granted ?Permission sought to share information with : Case Manager ?Permission granted to share information with : Yes, Verbal Permission Granted ?   ? Permission granted to share info w AGENCY: Outpatient rehabilitation [facility] ?   ?   ? ?Emotional Assessment ?Appearance:: Appears stated age ?Attitude/Demeanor/Rapport: Gracious, Engaged ?Affect (typically observed): Pleasant, Appropriate ?Orientation: : Oriented to Self, Oriented to Place, Oriented to  Time, Oriented to Situation ?Alcohol / Substance Use: Not Applicable ?Psych Involvement: No (comment) ? ?Admission diagnosis:  Vertigo [R42] ?Hyperglycemia [R73.9] ?Acute CVA (cerebrovascular accident) (HCC) [I63.9] ?Patient Active Problem List  ? Diagnosis Date Noted  ? Acute CVA (cerebrovascular accident) (HCC) 12/22/2021  ? Diabetes mellitus without complication (HCC)   ? Hypertension   ? Uncontrolled type 2 diabetes mellitus with hyperglycemia, without long-term current use of insulin (HCC)   ? MSSA (methicillin susceptible Staphylococcus aureus) infection 09/16/2020  ? Carbuncle 09/16/2020  ? ?PCP:  [facility] ?Pharmacy:   ?[id number] - [place], [place] - [address] ?[address] ?[place] [address] ?Phone: [phone] Fax: [phone] ? ? ? ? ?  Social Determinants of Health (SDOH) Interventions ?  ? ?Readmission Risk Interventions ?   ? View : No data to display.  ?  ?  ?  ? ? ? ?

## 2021-12-23 NOTE — Progress Notes (Addendum)
CTA of head obtained overnight revealed that the potential occlusion seen on MRA is actually due to severe stenosis of the right middle cerebral artery M1 segment. There was slightly delayed flow in portions of the right MCA distribution, but not fitting criteria of a stenosis requiring emergent treatment. Discussed with Interventional Radiology and there is no indication for stenting of a critical stenosis that is not associated with neurological symptoms localizable to that territory (his stroke symptoms localize to the medial right thalamus and midbrain, not to the right MCA territory). Given the severe stenosis, would increase atorvastatin to 80 mg po qd and add Plavix to the patient's regimen.  ? ?TTE is pending.  ? ?The patient will require outpatient Neurology follow up within 2-4 weeks.  ? ?Electronically signed: Dr. Kerney Elbe ? ?

## 2021-12-23 NOTE — Progress Notes (Signed)
Physical Therapy Treatment ?Patient Details ?Name: Roy Walker ?MRN: [id number] ?DOB: [birth year] ?Today's Date: 12/23/2021 ? ? ?History of Present Illness Pt is 56 y/o male admitted and CT head with no acute intracranial finding however MRI showing small acute infarct of the dorsal medial right thalamus. ? ?  ?PT Comments  ? ? Patient alert and agreeable to PT, denied pain. He was able to perform all mobility independently, but did still exhibit some staggering as well as impulsivity with mobility. Trialed SPC again, as well as dynamic gait tasks to further assess balance and to provide pt with education. Pt also educated on importance of follow up for vision as well as potential use of SPC. Returned to room with all needs in reach. The patient would benefit from further skilled PT intervention to continue to progress towards goals. Recommendation remains appropriate.  ?   ?Recommendations for follow up therapy are one component of a multi-disciplinary discharge planning process, led by the attending physician.  Recommendations may be updated based on patient status, additional functional criteria and insurance authorization. ? ?Follow Up Recommendations ? Outpatient PT ?  ?  ?Assistance Recommended at Discharge PRN  ?Patient can return home with the following Assist for transportation;Direct supervision/assist for medications management ?  ?Equipment Recommendations ? Cane  ?  ?Recommendations for Other Services   ? ? ?  ?Precautions / Restrictions Precautions ?Precautions: Fall ?Restrictions ?Weight Bearing Restrictions: No  ?  ? ?Mobility ? Bed Mobility ?Overal bed mobility: Independent ?  ?  ?  ?  ?  ?  ?  ?  ? ?Transfers ?Overall transfer level: Modified independent ?Equipment used: None, Straight cane ?  ?  ?  ?  ?  ?  ?  ?  ?  ? ?Ambulation/Gait ?Ambulation/Gait assistance: Supervision, Min guard ?Gait Distance (Feet): 300 Feet ?Assistive device: Straight cane, None ?  ?  ?  ?  ?General Gait Details:  varying dynamic tasks during ambulation, pt does still have staggering, educated on use of SPC, unclear if pt will continue to use or not at discharge ? ? ?Stairs ?  ?  ?  ?  ?  ? ? ?Wheelchair Mobility ?  ? ?Modified Rankin (Stroke Patients Only) ?  ? ? ?  ?Balance Overall balance assessment: Needs assistance ?Sitting-balance support: Feet supported ?Sitting balance-Leahy Scale: Normal ?  ?  ?Standing balance support: During functional activity ?Standing balance-Leahy Scale: Good ?  ?  ?  ?  ?  ?  ?  ?  ?  ?  ?  ?  ?  ? ?  ?Cognition Arousal/Alertness: Awake/alert ?Behavior During Therapy: [facility] for tasks assessed/performed ?Overall Cognitive Status: Within Functional Limits for tasks assessed ?  ?  ?  ?  ?  ?  ?  ?  ?  ?  ?  ?  ?  ?  ?  ?  ?General Comments: a little impulsive ?  ?  ? ?  ?Exercises   ? ?  ?General Comments   ?  ?  ? ?Pertinent Vitals/Pain Pain Assessment ?Pain Assessment: No/denies pain  ? ? ?Home Living   ?  ?  ?  ?  ?  ?  ?  ?  ?  ?   ?  ?Prior Function    ?  ?  ?   ? ?PT Goals (current goals can now be found in the care plan section) Progress towards PT goals: Progressing toward goals ? ?  ?Frequency ? ? ?  Min 2X/week ? ? ? ?  ?PT Plan Current plan remains appropriate  ? ? ?Co-evaluation   ?  ?  ?  ?  ? ?  ?AM-PAC PT "6 Clicks" Mobility   ?Outcome Measure ? Help needed turning from your back to your side while in a flat bed without using bedrails?: None ?Help needed moving from lying on your back to sitting on the side of a flat bed without using bedrails?: None ?Help needed moving to and from a bed to a chair (including a wheelchair)?: None ?Help needed standing up from a chair using your arms (e.g., wheelchair or bedside chair)?: None ?Help needed to walk in hospital room?: None ?Help needed climbing 3-5 steps with a railing? : None ?6 Click Score: 24 ? ?  ?End of Session   ?Activity Tolerance: Patient tolerated treatment well ?Patient left: in bed;with call bell/phone within reach ?Nurse  Communication: Mobility status ?PT Visit Diagnosis: Other abnormalities of gait and mobility (R26.89);Difficulty in walking, not elsewhere classified (R26.2) ?  ? ? ?Time: [id number] ?PT Time Calculation (min) (ACUTE ONLY): 9 min ? ?Charges:  $Therapeutic Activity: 8-22 mins          ?          ?Lieutenant Diego PT, DPT ?2:55 PM,12/23/21 ? ? ?

## 2021-12-23 NOTE — Progress Notes (Addendum)
Occupational Therapy * Physical Therapy * Speech Therapy ?       ? ? ?DATE ___19 April 2023________ ?PATIENT NAME______[name]________ ?PATIENT MRN________[id number]____________ ? ?DIAGNOSIS/DIAGNOSIS CODE _i63.9__Acute CVA_ ?DATE OF DISCHARGE: ___________ ? ?PRIMARY CARE PHYSICIAN  ?[facility]   ?   ?  [phone]  ? ? ?  ? ?Dear Provider (Name: __________________   ?Fax: ___________________________): ?  ?I certify that I have examined this patient and that occupational/physical/speech therapy is necessary on an outpatient basis.   ? ?The patient has expressed interest in completing their recommended course of therapy at your location.  Once a formal order from the patient's primary care physician has been obtained, please contact him/her to schedule an appointment for evaluation at your earliest convenience. ? ? ?[ x ]  Physical Therapy Evaluate and Treat ? ?        [ x ]  Occupational Therapy Evaluate and Treat ? ?              [  ]  Speech Therapy Evaluate and Treat ? ? ? ?The patient's primary care physician (listed above) must furnish and be responsible for a formal order such that the recommended services may be furnished while under the primary physician's care, and that the plan of care will be established and reviewed every 30 days (or more often if condition necessitates).  ?

## 2021-12-24 MED ORDER — BASAGLAR KWIKPEN 100 UNIT/ML ~~LOC~~ SOPN
16.0000 [IU] | PEN_INJECTOR | Freq: Every day | SUBCUTANEOUS | 11 refills | Status: DC
Start: 2021-12-24 — End: 2024-01-16

## 2021-12-24 NOTE — Telephone Encounter (Signed)
Changed to basaglar 

## 2022-01-04 LAB — DRUG SCREEN 10 W/CONF, SERUM
Amphetamines, IA: NEGATIVE ng/mL
Barbiturates, IA: NEGATIVE ug/mL
Benzodiazepines, IA: NEGATIVE ng/mL
Cocaine & Metabolite, IA: NEGATIVE ng/mL
Methadone, IA: NEGATIVE ng/mL
Opiates, IA: NEGATIVE ng/mL
Oxycodones, IA: NEGATIVE ng/mL
Phencyclidine, IA: NEGATIVE ng/mL
Propoxyphene, IA: NEGATIVE ng/mL
THC(Marijuana) Metabolite, IA: NEGATIVE ng/mL

## 2022-01-04 LAB — THC,MS,WB/SP RFX
Cannabidiol: NEGATIVE ng/mL
Cannabinoid Confirmation: NEGATIVE
Carboxy-THC: NEGATIVE ng/mL
Hydroxy-THC: NEGATIVE ng/mL
Tetrahydrocannabinol(THC): NEGATIVE ng/mL

## 2022-08-08 ENCOUNTER — Emergency Department: Payer: BC Managed Care – PPO

## 2022-08-08 ENCOUNTER — Other Ambulatory Visit: Payer: Self-pay

## 2022-08-08 ENCOUNTER — Observation Stay
Admission: EM | Admit: 2022-08-08 | Discharge: 2022-08-10 | Disposition: A | Discharge status: Home / Self Care | Payer: BC Managed Care – PPO | Attending: Internal Medicine | Admitting: Internal Medicine

## 2022-08-08 ENCOUNTER — Encounter: Payer: Self-pay | Admitting: Emergency Medicine

## 2022-08-08 DIAGNOSIS — I10 Essential (primary) hypertension: Secondary | ICD-10-CM | POA: Diagnosis present

## 2022-08-08 DIAGNOSIS — I639 Cerebral infarction, unspecified: Principal | ICD-10-CM | POA: Insufficient documentation

## 2022-08-08 DIAGNOSIS — Z7902 Long term (current) use of antithrombotics/antiplatelets: Secondary | ICD-10-CM | POA: Insufficient documentation

## 2022-08-08 DIAGNOSIS — Z794 Long term (current) use of insulin: Secondary | ICD-10-CM | POA: Diagnosis not present

## 2022-08-08 DIAGNOSIS — Z8673 Personal history of transient ischemic attack (TIA), and cerebral infarction without residual deficits: Secondary | ICD-10-CM

## 2022-08-08 DIAGNOSIS — Z79899 Other long term (current) drug therapy: Secondary | ICD-10-CM | POA: Insufficient documentation

## 2022-08-08 DIAGNOSIS — R202 Paresthesia of skin: Secondary | ICD-10-CM

## 2022-08-08 DIAGNOSIS — I110 Hypertensive heart disease with heart failure: Secondary | ICD-10-CM | POA: Insufficient documentation

## 2022-08-08 DIAGNOSIS — E1165 Type 2 diabetes mellitus with hyperglycemia: Secondary | ICD-10-CM | POA: Insufficient documentation

## 2022-08-08 DIAGNOSIS — I5032 Chronic diastolic (congestive) heart failure: Secondary | ICD-10-CM | POA: Diagnosis not present

## 2022-08-08 DIAGNOSIS — Z87891 Personal history of nicotine dependence: Secondary | ICD-10-CM | POA: Diagnosis not present

## 2022-08-08 DIAGNOSIS — Z7984 Long term (current) use of oral hypoglycemic drugs: Secondary | ICD-10-CM | POA: Diagnosis not present

## 2022-08-08 DIAGNOSIS — R200 Anesthesia of skin: Secondary | ICD-10-CM

## 2022-08-08 DIAGNOSIS — Z7982 Long term (current) use of aspirin: Secondary | ICD-10-CM | POA: Insufficient documentation

## 2022-08-08 DIAGNOSIS — E119 Type 2 diabetes mellitus without complications: Secondary | ICD-10-CM

## 2022-08-08 LAB — COMPREHENSIVE METABOLIC PANEL
ALT: 34 U/L (ref 0–44)
AST: 45 U/L — ABNORMAL HIGH (ref 15–41)
Albumin: 4.2 g/dL (ref 3.5–5.0)
Alkaline Phosphatase: 97 U/L (ref 38–126)
Anion gap: 8 (ref 5–15)
BUN: 22 mg/dL — ABNORMAL HIGH (ref 6–20)
CO2: 26 mmol/L (ref 22–32)
Calcium: 9.4 mg/dL (ref 8.9–10.3)
Chloride: 104 mmol/L (ref 98–111)
Creatinine, Ser: 0.68 mg/dL (ref 0.61–1.24)
GFR, Estimated: >60 mL/min (ref >60)
Glucose, Bld: 258 mg/dL — ABNORMAL HIGH (ref 70–99)
Potassium: 3.8 mmol/L (ref 3.5–5.1)
Sodium: 138 mmol/L (ref 135–145)
Total Bilirubin: 1 mg/dL (ref 0.3–1.2)
Total Protein: 7.4 g/dL (ref 6.5–8.1)

## 2022-08-08 LAB — CBC
HCT: 48.6 % (ref 39.0–52.0)
Hemoglobin: 16.5 g/dL (ref 13.0–17.0)
MCH: 31.8 pg (ref 26.0–34.0)
MCHC: 34 g/dL (ref 30.0–36.0)
MCV: 93.6 fL (ref 80.0–100.0)
Platelets: 246 10*3/uL (ref 150–400)
RBC: 5.19 MIL/uL (ref 4.22–5.81)
RDW: 13.2 % (ref 11.5–15.5)
WBC: 11.2 10*3/uL — ABNORMAL HIGH (ref 4.0–10.5)
nRBC: 0 % (ref 0.0–0.2)

## 2022-08-08 LAB — ETHANOL: Alcohol, Ethyl (B): <10 mg/dL (ref <10)

## 2022-08-08 LAB — DIFFERENTIAL
Abs Immature Granulocytes: 0.03 10*3/uL (ref 0.00–0.07)
Basophils Absolute: 0.1 10*3/uL (ref 0.0–0.1)
Basophils Relative: 1 %
Eosinophils Absolute: 0.3 10*3/uL (ref 0.0–0.5)
Eosinophils Relative: 3 %
Immature Granulocytes: 0 %
Lymphocytes Relative: 30 %
Lymphs Abs: 3.3 10*3/uL (ref 0.7–4.0)
Monocytes Absolute: 1 10*3/uL (ref 0.1–1.0)
Monocytes Relative: 9 %
Neutro Abs: 6.5 10*3/uL (ref 1.7–7.7)
Neutrophils Relative %: 57 %

## 2022-08-08 LAB — APTT: aPTT: 26 seconds (ref 24–36)

## 2022-08-08 LAB — PROTIME-INR
INR: 1.1 (ref 0.8–1.2)
Prothrombin Time: 14.4 seconds (ref 11.4–15.2)

## 2022-08-08 MED ORDER — IOHEXOL 350 MG/ML SOLN
75.0000 mL | Freq: Once | INTRAVENOUS | Status: AC | PRN
  Administered 2022-08-08: 75 mL via INTRAVENOUS

## 2022-08-08 MED ORDER — SODIUM CHLORIDE 0.9% FLUSH
3.0000 mL | Freq: Once | INTRAVENOUS | Status: AC
  Administered 2022-08-08: 3 mL via INTRAVENOUS

## 2022-08-08 MED ORDER — ASPIRIN 81 MG PO CHEW
324.0000 mg | CHEWABLE_TABLET | Freq: Once | ORAL | Status: AC
  Administered 2022-08-08: 324 mg via ORAL
  Filled 2022-08-08: qty 4

## 2022-08-08 NOTE — Assessment & Plan Note (Signed)
Blood sugar over 200 Continue basal insulin Sliding scale coverage

## 2022-08-08 NOTE — ED Provider Notes (Signed)
[facility] Provider Note    Event Date/Time   First MD Initiated Contact with Patient 08/08/22 2157     (approximate)   History   Numbness   HPI  Roy Walker is a 56 y.o. male with past medical history significant for diabetes, hypertension, who presents to the emergency department with paresthesias to the right hemibody.  Patient states that he had a prior TIA/CVA in April earlier this year.  Did not have any residual deficits.  Last night went to sleep around 10 PM was in his normal state of health.  This morning when he woke up he had paresthesias and tingling sensation to the entire left body.  Ongoing him since that time.  Mild improvement to his arm and leg.  Denies any recent falls or head trauma.  No change in vision or slurring of speech.  No nausea or vomiting.  No diarrhea.  No chest pain.     Physical Exam   Triage Vital Signs: ED Triage Vitals  Enc Vitals Group     BP 08/08/22 1611 (!) 160/109     Pulse Rate 08/08/22 1611 77     Resp 08/08/22 1611 18     Temp 08/08/22 1611 98.4 F (36.9 C)     Temp Source 08/08/22 2150 Oral     SpO2 08/08/22 1611 99 %     Weight 08/08/22 1607 190 lb (86.2 kg)     Height 08/08/22 1607 5\' 7"  (1.702 m)     Head Circumference --      Peak Flow --      Pain Score 08/08/22 1607 0     Pain Loc --      Pain Edu? --      Excl. in GC? --     Most recent vital signs: Vitals:   08/08/22 2150 08/08/22 2251  BP: (!) 154/103 (!) 150/107  Pulse: 72 64  Resp: 20 19  Temp: 98.3 F (36.8 C)   SpO2: 93% 99%    Physical Exam Constitutional:      Appearance: He is well-developed.  HENT:     Head: Atraumatic.  Eyes:     Conjunctiva/sclera: Conjunctivae normal.  Cardiovascular:     Rate and Rhythm: Regular rhythm.  Pulmonary:     Effort: No respiratory distress.  Musculoskeletal:     Cervical back: Normal range of motion.  Skin:    General: Skin is warm.  Neurological:     Mental Status: He  is alert. Mental status is at baseline.     GCS: GCS eye subscore is 4. GCS verbal subscore is 5. GCS motor subscore is 6.     Cranial Nerves: Cranial nerves 2-12 are intact.     Sensory: Sensory deficit (Decreased sensation to the left face.) present.     Motor: Motor function is intact.     Coordination: Coordination is intact.     Gait: Gait is intact.     IMPRESSION / MDM / ASSESSMENT AND PLAN / ED COURSE  I reviewed the triage vital signs and the nursing notes.  Differential diagnosis including CVA, electrolyte abnormality, anemia, infectious process, dehydration  EKG  I, 14/03/23, the attending physician, personally viewed and interpreted this ECG.   Rate: Normal  Rhythm: Normal sinus  Axis: Normal  Intervals: Normal  ST&T Change: T wave inverted to lead V3 and aVF, lateral leads no significant ST elevation or depression.  No tachycardic or bradycardic dysrhythmias while on cardiac  telemetry.  RADIOLOGY I independently reviewed imaging, my interpretation of imaging: CT head without signs of intracranial hemorrhage.  Read as age-indeterminate right thalamic infarct.  Labs (all labs ordered are listed, but only abnormal results are displayed) Labs interpreted as -  No significant electrolyte abnormalities.  Hyperglycemia but no signs of DKA  Labs Reviewed  CBC - Abnormal; Notable for the following components:      Result Value   WBC 11.2 (*)    All other components within normal limits  COMPREHENSIVE METABOLIC PANEL - Abnormal; Notable for the following components:   Glucose, Bld 258 (*)    BUN 22 (*)    AST 45 (*)    All other components within normal limits  PROTIME-INR  APTT  DIFFERENTIAL  ETHANOL  CBG MONITORING, ED     Clinical Course as of 08/08/22 2338  [facility] Aug 08, 2022  2332 CT HEAD WO CONTRAST [HD]    Clinical Course User Index [HD] Andris Baumann, MD   After further discussion with the hospitalist patient had a prior MRI that showed a  stroke to the right thalamic area.  Recommended CT angiography.  CT angiography obtained.  Patient given aspirin.  PROCEDURES:  Critical Care performed: No  Procedures  Patient's presentation is most consistent with acute presentation with potential threat to life or bodily function.   MEDICATIONS ORDERED IN ED: Medications  sodium chloride flush (NS) 0.9 % injection 3 mL (has no administration in time range)  aspirin chewable tablet 324 mg (324 mg Oral Given 08/08/22 2245)  iohexol (OMNIPAQUE) 350 MG/ML injection 75 mL (75 mLs Intravenous Contrast Given 08/08/22 2332)    FINAL CLINICAL IMPRESSION(S) / ED DIAGNOSES   Final diagnoses:  Paresthesia  Numbness     Rx / DC Orders   ED Discharge Orders     None        Note:  This document was prepared using Dragon voice recognition software and may include unintentional dictation errors.   Corena Herter, MD 08/08/22 [phone]

## 2022-08-08 NOTE — Assessment & Plan Note (Signed)
Hold losartan for permissive hypertension

## 2022-08-08 NOTE — ED Triage Notes (Signed)
Pt via POV from home. Pt c/o L sided numbness including face, arm, and leg. Pt has a hx of TIA in April. Pt has a hx of DM and HTN. Denies any weakness. No facial palsy noted. LKW last night 10:00pm. Pt is A&OX4 and NAd

## 2022-08-08 NOTE — H&P (Signed)
Patient History and Physical    Patient: Roy Walker PPJ:[id number] DOB: 12-27-1965 DOA: 08/08/2022 DOS: the patient was seen and examined on 08/08/2022 PCP: [facility]  Patient coming from: Home  Chief Complaint:  Chief Complaint  Patient presents with   Numbness    HPI: Roy Walker is a 56 y.o. male with medical history significant for Diabetes and hypertension, diastolic dysfunction, hospitalized in April 2023 with a thalamic stroke presenting with dizziness who presented to the ED after waking at 6 AM on the morning of 08/08/2022 with left-sided hemibody numbness including face, arm and leg.  He denied weakness, slurred speech.  He was in his usual state of health when he went to bed the night prior. ED course and data review: BP 160/109 with otherwise normal vitals.  Labs significant for glucose of 258 WBC 11,200.  CMP, CBC, EtOH otherwise unremarkable.  EKG, personally viewed and interpreted shows sinus at 80 with nonspecific ST-T wave changes. CT head shows age-indeterminate right thalamic infarct with no evidence of hemorrhage CTA head and neck shows the following:   Hospitalist consulted for admission.   Review of Systems: As mentioned in the history of present illness. All other systems reviewed and are negative.  Past Medical History:  Diagnosis Date   Diabetes mellitus without complication ([place])    Hypertension    Past Surgical History:  Procedure Laterality Date   HERNIA REPAIR     UMBILICAL HERNIA REPAIR N/A 05/26/2018   Procedure: HERNIA REPAIR UMBILICAL ADULT;  Surgeon: Benjamine Sprague, DO;  Location: [facility] ORS;  Service: General;  Laterality: N/A;   Social History:  reports that he quit smoking about 16 years ago. His smoking use included cigarettes. He has never used smokeless tobacco. He reports current alcohol use of about 10.0 standard drinks of alcohol per week. He reports that he does not use drugs.  No Known  Allergies  History reviewed. No pertinent family history.  Prior to Admission medications   Medication Sig Start Date End Date Taking? Authorizing Provider  amLODipine (NORVASC) 5 MG tablet Take 5 mg by mouth daily.   Yes [provider]  glimepiride (AMARYL) 2 MG tablet Take 2 mg by mouth daily with breakfast.   Yes [provider]  insulin aspart (NOVOLOG) 100 UNIT/ML injection Inject 3 Units into the skin 3 (three) times daily with meals. 12/23/21  Yes Sid Falcon, MD  Insulin Glargine (BASAGLAR KWIKPEN) 100 UNIT/ML Inject 16 Units into the skin daily. 12/24/21  Yes Sid Falcon, MD  JARDIANCE 10 MG TABS tablet Take 10 mg by mouth daily. 12/17/21  Yes [provider]  losartan (COZAAR) 50 MG tablet Take 1 tablet (50 mg total) by mouth daily. 12/23/21  Yes Sid Falcon, MD  metFORMIN (GLUCOPHAGE) 1000 MG tablet Take 1,000 mg by mouth 2 (two) times daily. 12/17/21  Yes [provider]  ZOLOFT 50 MG tablet Take 50 mg by mouth daily. 06/17/22  Yes [provider]  aspirin EC 81 MG EC tablet Take 1 tablet (81 mg total) by mouth daily. Swallow whole. Patient not taking: Reported on 08/08/2022 12/24/21   Sid Falcon, MD  atorvastatin (LIPITOR) 80 MG tablet Take 1 tablet (80 mg total) by mouth daily. Patient not taking: Reported on 08/08/2022 12/24/21   Sid Falcon, MD  blood glucose meter kit and supplies KIT Dispense based on patient and insurance preference. Check blood sugar three times daily before meals. Patient not taking: Reported  on 08/08/2022 10/19/16   [name], MD  clopidogrel (PLAVIX) 75 MG tablet Take 1 tablet (75 mg total) by mouth daily. Patient not taking: Reported on 08/08/2022 12/23/21   Sid Falcon, MD  cyclobenzaprine (FLEXERIL) 5 MG tablet Take 5 mg by mouth 3 (three) times daily as needed. 10/26/21   [provider]  fenofibrate 160 MG tablet Take 1 tablet (160 mg total) by mouth daily. Patient not  taking: Reported on 08/08/2022 12/24/21   Sid Falcon, MD  VALTREX 1 g tablet Take by mouth 2 (two) times daily. Patient not taking: Reported on 08/08/2022 12/17/21   [provider]    Physical Exam: Vitals:   08/08/22 1607 08/08/22 1611 08/08/22 2150 08/08/22 2251  BP:  (!) 160/109 (!) 154/103 (!) 150/107  Pulse:  77 72 64  Resp:  _0 Temp:  98.4 F (36.9 C) 98.3 F (36.8 C)   TempSrc:   Oral   SpO2:  99% 93% 99%  Weight: 86.2 kg     Height: _1  (1.702 m)      Physical Exam  Labs on Admission: I have personally reviewed following labs and imaging studies  CBC: Recent Labs  Lab 08/08/22 1614  WBC 11.2*  NEUTROABS 6.5  HGB 16.5  HCT 48.6  MCV 93.6  PLT 366    Basic Metabolic Panel: Recent Labs  Lab 08/08/22 1614  NA 138  K 3.8  CL 104  CO2 26  GLUCOSE 258*  BUN 22*  CREATININE 0.68  CALCIUM 9.4    GFR: Estimated Creatinine Clearance: 106.8 mL/min (by C-G formula based on SCr of 0.68 mg/dL). Liver Function Tests: Recent Labs  Lab 08/08/22 1614  AST 45*  ALT 34  ALKPHOS 97  BILITOT 1.0  PROT 7.4  ALBUMIN 4.2    No results for input(s): "LIPASE", "AMYLASE" in the last 168 hours. No results for input(s): "AMMONIA" in the last 168 hours. Coagulation Profile: Recent Labs  Lab 08/08/22 1614  INR 1.1    Cardiac Enzymes: No results for input(s): "CKTOTAL", "CKMB", "CKMBINDEX", "TROPONINI" in the last 168 hours. BNP (last 3 results) No results for input(s): "PROBNP" in the last 8760 hours. HbA1C: No results for input(s): "HGBA1C" in the last 72 hours. CBG: No results for input(s): "GLUCAP" in the last 168 hours. Lipid Profile: No results for input(s): "CHOL", "HDL", "LDLCALC", "TRIG", "CHOLHDL", "LDLDIRECT" in the last 72 hours. Thyroid Function Tests: No results for input(s): "TSH", "T4TOTAL", "FREET4", "T3FREE", "THYROIDAB" in the last 72 hours. Anemia Panel: No results for input(s): "VITAMINB12", "FOLATE", "FERRITIN",  "TIBC", "IRON", "RETICCTPCT" in the last 72 hours. Urine analysis:    Component Value Date/Time   COLORURINE STRAW (A) 12/22/2021 0245   APPEARANCEUR CLEAR (A) 12/22/2021 0245   LABSPEC 1.029 12/22/2021 0245   PHURINE 5.0 12/22/2021 0245   GLUCOSEU >=500 (A) 12/22/2021 0245   HGBUR NEGATIVE 12/22/2021 0245   BILIRUBINUR NEGATIVE 12/22/2021 0245   KETONESUR NEGATIVE 12/22/2021 0245   PROTEINUR NEGATIVE 12/22/2021 0245   NITRITE NEGATIVE 12/22/2021 0245   LEUKOCYTESUR NEGATIVE 12/22/2021 0245    Radiological Exams on Admission: CT HEAD WO CONTRAST  Result Date: 08/08/2022 CLINICAL DATA:  56 year old male with LEFT facial numbness. EXAM: CT HEAD WITHOUT CONTRAST TECHNIQUE: Contiguous axial images were obtained from the base of the skull through the vertex without intravenous contrast. RADIATION DOSE REDUCTION: This exam was performed according to the departmental dose-optimization program which includes automated exposure control, adjustment of the mA and/or  kV according to patient size and/or use of iterative reconstruction technique. COMPARISON:  12/22/2021 CT and prior studies FINDINGS: Brain: An age indeterminate RIGHT thalamic infarct appears in a slightly different location from the infarct identified on 12/22/2021 MR. There is no evidence of hemorrhage, mass lesion or mass effect, hydrocephalus, midline shift or extra-axial collection. Mild atrophy and mild chronic small-vessel white matter ischemic changes again noted. Vascular: Carotid atherosclerotic calcifications are noted. Skull: Normal. Negative for fracture or focal lesion. Sinuses/Orbits: No acute finding. Other: None. IMPRESSION: 1. Age indeterminate RIGHT thalamic infarct. No evidence of hemorrhage. 2. Mild atrophy and mild chronic small-vessel white matter ischemic changes. Electronically Signed   By: Margarette Canada M.D.   On: 08/08/2022 17:20     Data Reviewed: Relevant notes from primary care and specialist visits, past  discharge summaries as available in EHR, including Care Everywhere. Prior diagnostic testing as pertinent to current admission diagnoses Updated medications and problem lists for reconciliation ED course, including vitals, labs, imaging, treatment and response to treatment Triage notes, nursing and pharmacy notes and ED provider's notes Notable results as noted in HPI   Assessment and Plan: Acute CVA (cerebrovascular accident) [facility]) History of CVA 12/2021 Permissive hypertension for first 24-48 hrs post stroke onset: Prn Labetalol IV or Vasotec IV If BP greater than 220/120  Statins for LDL goal less than 70 ASA 75m daily, Plavix 732mdaily x 3 weeks then monotherapy thereafter.  Continue atorvastatin Telemetry, (had echo April 2023 that showed grade 2 diastolic dysfunction.  Will not repeat) Follow-up CTA head and neck and get MRI Avoid dextrose containing fluids, Maintain euglycemia, euthermia Neuro checks q4 hrs x 24 hrs and then per shift Head of bed 30 degrees Physical therapy/Occupational therapy/Speech therapy if failed dysphagia screen Neurology consult to follow   Uncontrolled type 2 diabetes mellitus with hyperglycemia, with long-term current use of insulin (HCC) Blood sugar over 200 Continue basal insulin Sliding scale coverage  Chronic diastolic CHF (congestive heart failure) (HCC) Clinically euvolemic.  Holding losartan and to allow for permissive hypertension  Essential hypertension Hold losartan for permissive hypertension    DVT prophylaxis: Lovenox  Consults: neurology, DR [place]:   Code Status: Prior   Family Communication: none  Disposition Plan: Back to previous home environment  Severity of Illness: The appropriate patient status for this patient is OBSERVATION. Observation status is judged to be reasonable and necessary in order to provide the required intensity of service to ensure the patient's safety. The patient's  presenting symptoms, physical exam findings, and initial radiographic and laboratory data in the context of their medical condition is felt to place them at decreased risk for further clinical deterioration. Furthermore, it is anticipated that the patient will be medically stable for discharge from the hospital within 2 midnights of admission.   Author: HaAthena MasseMD 08/08/2022 11:39 PM  For on call review [email or web address]

## 2022-08-08 NOTE — Assessment & Plan Note (Addendum)
Clinically euvolemic.  Holding losartan and to allow for permissive hypertension

## 2022-08-08 NOTE — ED Provider Triage Note (Signed)
Emergency Medicine Provider Triage Evaluation Note  Roy Walker  a 56 y.o. male  was evaluated in triage.  Pt complains of left-sided facial numbness and tingling to the left upper extremity lower extremity.  Patient reports he awoke with the symptoms after going to bed last night at about 10:30.  He gives a history of CVA earlier this year, along with his history of diabetes.  Review of Systems  Positive: Facial numbness, extremity numbness Negative: CP  Physical Exam  Ht 5\' 7"  (1.702 m)   Wt 86.2 kg   BMI 29.76 kg/m  Gen:   Awake, no distress  NAD Resp:  Normal effort CTA MSK:   Moves extremities without difficulty  Other:    Medical Decision Making  Medically screening exam initiated at 4:10 PM.  Appropriate orders placed.  Roy Walker was informed that the remainder of the evaluation will be completed by another provider, this initial triage assessment does not replace that evaluation, and the importance of remaining in the ED until their evaluation is complete.  Patient to the ED for evaluation of left-sided facial numbness and left sided numbness with onset upon awakening this morning.  Patient was last known well at bedtime last night 10:30 PM.   Roy Rang, PA-C 08/08/22 1611

## 2022-08-08 NOTE — Assessment & Plan Note (Addendum)
History of CVA 12/2021 Permissive hypertension for first 24-48 hrs post stroke onset: Prn Labetalol IV or Vasotec IV If BP greater than 220/120  Statins for LDL goal less than 70 ASA 81mg  daily, Plavix 75mg  daily x 3 weeks then monotherapy thereafter.  Continue atorvastatin Telemetry, (had echo April 2023 that showed grade 2 diastolic dysfunction.  Will not repeat) Follow-up CTA head and neck and get MRI Avoid dextrose containing fluids, Maintain euglycemia, euthermia Neuro checks q4 hrs x 24 hrs and then per shift Head of bed 30 degrees Physical therapy/Occupational therapy/Speech therapy if failed dysphagia screen Neurology consult to follow

## 2022-08-08 NOTE — H&P (Incomplete)
History and Physical    Patient: Roy Walker HRC:[id number] DOB: 06-Jun-1966 DOA: 08/08/2022 DOS: the patient was seen and examined on 08/08/2022 PCP: [facility] [place]  Patient coming from: Home  Chief Complaint:  Chief Complaint  Patient presents with   Numbness    HPI: Roy Walker is a 56 y.o. male with medical history significant for Diabetes and hypertension, diastolic dysfunction, hospitalized in April 2023 with a thalamic stroke presenting with dizziness who presented to the ED after waking at 6 AM on the morning of 08/08/2022 with left-sided hemibody numbness including face, arm and leg.  He denied weakness, slurred speech.  He was in his usual state of health when he went to bed the night prior. ED course and data review: BP 160/109 with otherwise normal vitals.  Labs significant for glucose of 258 WBC 11,200.  CMP, CBC, EtOH otherwise unremarkable.  EKG, personally viewed and interpreted shows sinus at 80 with nonspecific ST-T wave changes. CT head shows age-indeterminate right thalamic infarct with no evidence of hemorrhage CTA head and neck shows the following:   Hospitalist consulted for admission.   Review of Systems: As mentioned in the history of present illness. All other systems reviewed and are negative.  Past Medical History:  Diagnosis Date   Diabetes mellitus without complication ([place])    Hypertension    Past Surgical History:  Procedure Laterality Date   HERNIA REPAIR     UMBILICAL HERNIA REPAIR N/A 05/26/2018   Procedure: HERNIA REPAIR UMBILICAL ADULT;  Surgeon: Benjamine Sprague, DO;  Location: [facility] ORS;  Service: General;  Laterality: N/A;   Social History:  reports that he quit smoking about 16 years ago. His smoking use included cigarettes. He has never used smokeless tobacco. He reports current alcohol use of about 10.0 standard drinks of alcohol per week. He reports that he does not use drugs.  No Known  Allergies  History reviewed. No pertinent family history.  Prior to Admission medications   Medication Sig Start Date End Date Taking? Authorizing Provider  amLODipine (NORVASC) 5 MG tablet Take 5 mg by mouth daily.   Yes [provider]  glimepiride (AMARYL) 2 MG tablet Take 2 mg by mouth daily with breakfast.   Yes [provider]  insulin aspart (NOVOLOG) 100 UNIT/ML injection Inject 3 Units into the skin 3 (three) times daily with meals. 12/23/21  Yes Sid Falcon, MD  Insulin Glargine (BASAGLAR KWIKPEN) 100 UNIT/ML Inject 16 Units into the skin daily. 12/24/21  Yes Sid Falcon, MD  JARDIANCE 10 MG TABS tablet Take 10 mg by mouth daily. 12/17/21  Yes [provider]  losartan (COZAAR) 50 MG tablet Take 1 tablet (50 mg total) by mouth daily. 12/23/21  Yes Sid Falcon, MD  metFORMIN (GLUCOPHAGE) 1000 MG tablet Take 1,000 mg by mouth 2 (two) times daily. 12/17/21  Yes [provider]  ZOLOFT 50 MG tablet Take 50 mg by mouth daily. 06/17/22  Yes [provider]  aspirin EC 81 MG EC tablet Take 1 tablet (81 mg total) by mouth daily. Swallow whole. Patient not taking: Reported on 08/08/2022 12/24/21   Sid Falcon, MD  atorvastatin (LIPITOR) 80 MG tablet Take 1 tablet (80 mg total) by mouth daily. Patient not taking: Reported on 08/08/2022 12/24/21   Sid Falcon, MD  blood glucose meter kit and supplies KIT Dispense based on patient and insurance preference. Check blood sugar three times daily before meals. Patient not taking: Reported on  08/08/2022 10/19/16   [name], MD  clopidogrel (PLAVIX) 75 MG tablet Take 1 tablet (75 mg total) by mouth daily. Patient not taking: Reported on 08/08/2022 12/23/21   Sid Falcon, MD  cyclobenzaprine (FLEXERIL) 5 MG tablet Take 5 mg by mouth 3 (three) times daily as needed. 10/26/21   [provider]  fenofibrate 160 MG tablet Take 1 tablet (160 mg total) by mouth daily. Patient not  taking: Reported on 08/08/2022 12/24/21   Sid Falcon, MD  VALTREX 1 g tablet Take by mouth 2 (two) times daily. Patient not taking: Reported on 08/08/2022 12/17/21   [provider]    Physical Exam: Vitals:   08/08/22 1607 08/08/22 1611 08/08/22 2150 08/08/22 2251  BP:  (!) 160/109 (!) 154/103 (!) 150/107  Pulse:  77 72 64  Resp:  _0 Temp:  98.4 F (36.9 C) 98.3 F (36.8 C)   TempSrc:   Oral   SpO2:  99% 93% 99%  Weight: 86.2 kg     Height: _1  (1.702 m)      Physical Exam  Labs on Admission: I have personally reviewed following labs and imaging studies  CBC: Recent Labs  Lab 08/08/22 1614  WBC 11.2*  NEUTROABS 6.5  HGB 16.5  HCT 48.6  MCV 93.6  PLT 517   Basic Metabolic Panel: Recent Labs  Lab 08/08/22 1614  NA 138  K 3.8  CL 104  CO2 26  GLUCOSE 258*  BUN 22*  CREATININE 0.68  CALCIUM 9.4   GFR: Estimated Creatinine Clearance: 106.8 mL/min (by C-G formula based on SCr of 0.68 mg/dL). Liver Function Tests: Recent Labs  Lab 08/08/22 1614  AST 45*  ALT 34  ALKPHOS 97  BILITOT 1.0  PROT 7.4  ALBUMIN 4.2   No results for input(s): "LIPASE", "AMYLASE" in the last 168 hours. No results for input(s): "AMMONIA" in the last 168 hours. Coagulation Profile: Recent Labs  Lab 08/08/22 1614  INR 1.1   Cardiac Enzymes: No results for input(s): "CKTOTAL", "CKMB", "CKMBINDEX", "TROPONINI" in the last 168 hours. BNP (last 3 results) No results for input(s): "PROBNP" in the last 8760 hours. HbA1C: No results for input(s): "HGBA1C" in the last 72 hours. CBG: No results for input(s): "GLUCAP" in the last 168 hours. Lipid Profile: No results for input(s): "CHOL", "HDL", "LDLCALC", "TRIG", "CHOLHDL", "LDLDIRECT" in the last 72 hours. Thyroid Function Tests: No results for input(s): "TSH", "T4TOTAL", "FREET4", "T3FREE", "THYROIDAB" in the last 72 hours. Anemia Panel: No results for input(s): "VITAMINB12", "FOLATE", "FERRITIN", "TIBC",  "IRON", "RETICCTPCT" in the last 72 hours. Urine analysis:    Component Value Date/Time   COLORURINE STRAW (A) 12/22/2021 0245   APPEARANCEUR CLEAR (A) 12/22/2021 0245   LABSPEC 1.029 12/22/2021 0245   PHURINE 5.0 12/22/2021 0245   GLUCOSEU >=500 (A) 12/22/2021 0245   HGBUR NEGATIVE 12/22/2021 0245   BILIRUBINUR NEGATIVE 12/22/2021 0245   KETONESUR NEGATIVE 12/22/2021 0245   PROTEINUR NEGATIVE 12/22/2021 0245   NITRITE NEGATIVE 12/22/2021 0245   LEUKOCYTESUR NEGATIVE 12/22/2021 0245    Radiological Exams on Admission: CT HEAD WO CONTRAST  Result Date: 08/08/2022 CLINICAL DATA:  56 year old male with LEFT facial numbness. EXAM: CT HEAD WITHOUT CONTRAST TECHNIQUE: Contiguous axial images were obtained from the base of the skull through the vertex without intravenous contrast. RADIATION DOSE REDUCTION: This exam was performed according to the departmental dose-optimization program which includes automated exposure control, adjustment of the mA and/or kV according to patient size  and/or use of iterative reconstruction technique. COMPARISON:  12/22/2021 CT and prior studies FINDINGS: Brain: An age indeterminate RIGHT thalamic infarct appears in a slightly different location from the infarct identified on 12/22/2021 MR. There is no evidence of hemorrhage, mass lesion or mass effect, hydrocephalus, midline shift or extra-axial collection. Mild atrophy and mild chronic small-vessel white matter ischemic changes again noted. Vascular: Carotid atherosclerotic calcifications are noted. Skull: Normal. Negative for fracture or focal lesion. Sinuses/Orbits: No acute finding. Other: None. IMPRESSION: 1. Age indeterminate RIGHT thalamic infarct. No evidence of hemorrhage. 2. Mild atrophy and mild chronic small-vessel white matter ischemic changes. Electronically Signed   By: Margarette Canada M.D.   On: 08/08/2022 17:20     Data Reviewed: Relevant notes from primary care and specialist visits, past discharge  summaries as available in EHR, including Care Everywhere. Prior diagnostic testing as pertinent to current admission diagnoses Updated medications and problem lists for reconciliation ED course, including vitals, labs, imaging, treatment and response to treatment Triage notes, nursing and pharmacy notes and ED provider's notes Notable results as noted in HPI   Assessment and Plan: Acute CVA (cerebrovascular accident) [facility]) History of CVA 12/2021 Permissive hypertension for first 24-48 hrs post stroke onset: Prn Labetalol IV or Vasotec IV If BP greater than 220/120  Statins for LDL goal less than 70 ASA 68m daily, Plavix 774mdaily x 3 weeks then monotherapy thereafter.  Continue atorvastatin Telemetry, (had echo April 2023 that showed grade 2 diastolic dysfunction.  Will not repeat) Follow-up CTA head and neck and get MRI Avoid dextrose containing fluids, Maintain euglycemia, euthermia Neuro checks q4 hrs x 24 hrs and then per shift Head of bed 30 degrees Physical therapy/Occupational therapy/Speech therapy if failed dysphagia screen Neurology consult to follow   Uncontrolled type 2 diabetes mellitus with hyperglycemia, with long-term current use of insulin (HCC) Blood sugar over 200 Continue basal insulin Sliding scale coverage  Chronic diastolic CHF (congestive heart failure) (HCC) Clinically euvolemic.  Continue losartan    DVT prophylaxis: Lovenox  Consults: neurology, DR [place]:   Code Status: Prior   Family Communication: none  Disposition Plan: Back to previous home environment  Severity of Illness: The appropriate patient status for this patient is OBSERVATION. Observation status is judged to be reasonable and necessary in order to provide the required intensity of service to ensure the patient's safety. The patient's presenting symptoms, physical exam findings, and initial radiographic and laboratory data in the context of their medical  condition is felt to place them at decreased risk for further clinical deterioration. Furthermore, it is anticipated that the patient will be medically stable for discharge from the hospital within 2 midnights of admission.   Author: HaAthena MasseMD 08/08/2022 11:32 PM  For on call review [email or web address]

## 2022-08-09 ENCOUNTER — Encounter: Payer: Self-pay | Admitting: Radiology

## 2022-08-09 ENCOUNTER — Observation Stay: Payer: BC Managed Care – PPO

## 2022-08-09 DIAGNOSIS — Z794 Long term (current) use of insulin: Secondary | ICD-10-CM | POA: Diagnosis not present

## 2022-08-09 DIAGNOSIS — E1169 Type 2 diabetes mellitus with other specified complication: Secondary | ICD-10-CM

## 2022-08-09 DIAGNOSIS — I10 Essential (primary) hypertension: Secondary | ICD-10-CM

## 2022-08-09 DIAGNOSIS — I639 Cerebral infarction, unspecified: Secondary | ICD-10-CM | POA: Diagnosis not present

## 2022-08-09 LAB — CREATININE, SERUM
Creatinine, Ser: 0.5 mg/dL — ABNORMAL LOW (ref 0.61–1.24)
GFR, Estimated: >60 mL/min (ref >60)

## 2022-08-09 LAB — CBG MONITORING, ED
Glucose-Capillary: 211 mg/dL — ABNORMAL HIGH (ref 70–99)
Glucose-Capillary: 233 mg/dL — ABNORMAL HIGH (ref 70–99)
Glucose-Capillary: 313 mg/dL — ABNORMAL HIGH (ref 70–99)
Glucose-Capillary: 209 mg/dL — ABNORMAL HIGH (ref 70–99)
Glucose-Capillary: 259 mg/dL — ABNORMAL HIGH (ref 70–99)

## 2022-08-09 LAB — LIPID PANEL
Cholesterol: 241 mg/dL — ABNORMAL HIGH (ref 0–200)
HDL: 43 mg/dL (ref >40)
LDL Cholesterol: 147 mg/dL — ABNORMAL HIGH (ref 0–99)
Total CHOL/HDL Ratio: 5.6 RATIO
Triglycerides: 255 mg/dL — ABNORMAL HIGH (ref <150)
VLDL: 51 mg/dL — ABNORMAL HIGH (ref 0–40)

## 2022-08-09 LAB — CBC
HCT: 47.1 % (ref 39.0–52.0)
Hemoglobin: 15.7 g/dL (ref 13.0–17.0)
MCH: 31.5 pg (ref 26.0–34.0)
MCHC: 33.3 g/dL (ref 30.0–36.0)
MCV: 94.6 fL (ref 80.0–100.0)
Platelets: 230 10*3/uL (ref 150–400)
RBC: 4.98 MIL/uL (ref 4.22–5.81)
RDW: 13.2 % (ref 11.5–15.5)
WBC: 10.2 10*3/uL (ref 4.0–10.5)
nRBC: 0 % (ref 0.0–0.2)

## 2022-08-09 MED ORDER — ACETAMINOPHEN 325 MG PO TABS: 650.0000 mg | ORAL_TABLET | ORAL | Status: DC | PRN

## 2022-08-09 MED ORDER — ENOXAPARIN SODIUM 40 MG/0.4ML IJ SOSY
40.0000 mg | PREFILLED_SYRINGE | INTRAMUSCULAR | Status: DC
  Administered 2022-08-09 – 2022-08-10 (×2): 40 mg via SUBCUTANEOUS
  Filled 2022-08-09 (×2): qty 0.4

## 2022-08-09 MED ORDER — SERTRALINE HCL 50 MG PO TABS
50.0000 mg | ORAL_TABLET | Freq: Every day | ORAL | Status: DC
  Administered 2022-08-10: 50 mg via ORAL
  Filled 2022-08-09: qty 1

## 2022-08-09 MED ORDER — INSULIN ASPART 100 UNIT/ML IJ SOLN
0.0000 [IU] | Freq: Three times a day (TID) | INTRAMUSCULAR | Status: DC
  Administered 2022-08-09: 11 [IU] via SUBCUTANEOUS
  Administered 2022-08-09 (×2): 5 [IU] via SUBCUTANEOUS
  Administered 2022-08-10: 11 [IU] via SUBCUTANEOUS
  Administered 2022-08-10: 5 [IU] via SUBCUTANEOUS
  Filled 2022-08-09 (×5): qty 1

## 2022-08-09 MED ORDER — EMPAGLIFLOZIN 10 MG PO TABS
10.0000 mg | ORAL_TABLET | Freq: Every day | ORAL | Status: DC
  Filled 2022-08-09: qty 1

## 2022-08-09 MED ORDER — ACETAMINOPHEN 160 MG/5ML PO SOLN: 650.0000 mg | ORAL | Status: DC | PRN

## 2022-08-09 MED ORDER — INSULIN GLARGINE-YFGN 100 UNIT/ML ~~LOC~~ SOLN
16.0000 [IU] | Freq: Every day | SUBCUTANEOUS | Status: DC
  Administered 2022-08-09 – 2022-08-10 (×2): 16 [IU] via SUBCUTANEOUS
  Filled 2022-08-09 (×2): qty 0.16

## 2022-08-09 MED ORDER — ASPIRIN 81 MG PO TBEC
81.0000 mg | DELAYED_RELEASE_TABLET | Freq: Every day | ORAL | Status: DC
  Administered 2022-08-09 – 2022-08-10 (×2): 81 mg via ORAL
  Filled 2022-08-09 (×2): qty 1

## 2022-08-09 MED ORDER — STROKE: EARLY STAGES OF RECOVERY BOOK: Freq: Once | Status: AC

## 2022-08-09 MED ORDER — INSULIN ASPART 100 UNIT/ML IJ SOLN
0.0000 [IU] | Freq: Every day | INTRAMUSCULAR | Status: DC
  Administered 2022-08-09: 3 [IU] via SUBCUTANEOUS
  Administered 2022-08-09: 2 [IU] via SUBCUTANEOUS
  Filled 2022-08-09 (×2): qty 1

## 2022-08-09 MED ORDER — ATORVASTATIN CALCIUM 20 MG PO TABS
80.0000 mg | ORAL_TABLET | Freq: Every day | ORAL | Status: DC
  Administered 2022-08-09 – 2022-08-10 (×2): 80 mg via ORAL
  Filled 2022-08-09 (×2): qty 4

## 2022-08-09 MED ORDER — CLOPIDOGREL BISULFATE 75 MG PO TABS
75.0000 mg | ORAL_TABLET | Freq: Every day | ORAL | Status: DC
  Administered 2022-08-09 – 2022-08-10 (×2): 75 mg via ORAL
  Filled 2022-08-09 (×2): qty 1

## 2022-08-09 MED ORDER — ACETAMINOPHEN 650 MG RE SUPP: 650.0000 mg | RECTAL | Status: DC | PRN

## 2022-08-09 NOTE — Progress Notes (Signed)
OT Cancellation Note  Patient Details Name: Roy Walker MRN: [id number] DOB: 26-Mar-1966   Cancelled Treatment:    Reason Eval/Treat Not Completed: OT screened, no needs identified, will sign off. Per PT report, pt at baseline level of independence for mobility. Pt reports being able to perform all ADLs independently at this time. Only remaining deficit is numbness in L palm. OT provided encouragement and education to continue bilateral coordination tasks and to utilize as much as possible. OT to complete orders at this time as pt does not need skilled OT intervention at this time.   Jackquline Denmark, MS, OTR/L , CBIS ascom [phone]  08/09/22, 12:17 PM

## 2022-08-09 NOTE — Progress Notes (Signed)
PROGRESS NOTE    Roy Walker  XTK:[id number] DOB: 01-06-1966 DOA: 08/08/2022 PCP: [facility]    Assessment & Plan:   Principal Problem:   Acute CVA (cerebrovascular accident) [facility]) Active Problems:   Uncontrolled type 2 diabetes mellitus with hyperglycemia, with long-term current use of insulin (HCC)   Essential hypertension   Chronic diastolic CHF (congestive heart failure) (HCC)   History of CVA (cerebrovascular accident) 12/2021  Assessment and Plan: Acute vs subacute CVA: of right thalamus as per MRI brain. Allow permissive HTN. Continue on aspirin, plavix & statin. Echo April 2023 that showed grade 2 diastolic dysfunction. Continue w/ neuro checks. PT/OT consulted. Hx of previous CVA 12/2021. Neuro consulted    DM2: poorly controlled, HbA1c pending but previous HbA1c in 12/2021 was 14.0. Continue on glargine, SSI w/ accuchecks    Chronic diastolic CHF: appears euvolemic. Holding losartan   HTN: allow permissive HTN       DVT prophylaxis: lovenox  Code Status: full  Family Communication: discussed pt's care w/ pt's family at bedside and answered their questions  Disposition Plan: likely d/c back home   Level of care: Telemetry Medical  Status is: Observation The patient remains OBS appropriate and will d/c before 2 midnights.    Consultants:  Neuro   Procedures:   Antimicrobials:    Subjective: Pt c/o fatigue   Objective: Vitals:   08/08/22 2150 08/08/22 2251 08/09/22 0000 08/09/22 0610  BP: (!) 154/103 (!) 150/107 128/72 (!) 140/92  Pulse: 72 64 65 64  Resp: 20 19 17 15   Temp: 98.3 F (36.8 C)     TempSrc: Oral     SpO2: 93% 99% 94% 91%  Weight:      Height:       No intake or output data in the 24 hours ending 08/09/22 0815 Filed Weights   08/08/22 1607  Weight: 86.2 kg    Examination:  General exam: Appears calm and comfortable  Respiratory system: Clear to auscultation. Respiratory effort  normal. Cardiovascular system: S1 & S2 +. No rubs, gallops or clicks. No pedal edema. Gastrointestinal system: Abdomen is nondistended, soft and nontender. Normal bowel sounds heard. Central nervous system: Alert and oriented. Moves all extremities  Psychiatry: Judgement and insight appear normal. Mood & affect appropriate.     Data Reviewed: I have personally reviewed following labs and imaging studies  CBC: Recent Labs  Lab 08/08/22 1614 08/09/22 0155  WBC 11.2* 10.2  NEUTROABS 6.5  --   HGB 16.5 15.7  HCT 48.6 47.1  MCV 93.6 94.6  PLT 246 230   Basic Metabolic Panel: Recent Labs  Lab 08/08/22 1614 08/09/22 0155  NA 138  --   K 3.8  --   CL 104  --   CO2 26  --   GLUCOSE 258*  --   BUN 22*  --   CREATININE 0.68 0.50*  CALCIUM 9.4  --    GFR: Estimated Creatinine Clearance: 106.8 mL/min (A) (by C-G formula based on SCr of 0.5 mg/dL (L)). Liver Function Tests: Recent Labs  Lab 08/08/22 1614  AST 45*  ALT 34  ALKPHOS 97  BILITOT 1.0  PROT 7.4  ALBUMIN 4.2   No results for input(s): "LIPASE", "AMYLASE" in the last 168 hours. No results for input(s): "AMMONIA" in the last 168 hours. Coagulation Profile: Recent Labs  Lab 08/08/22 1614  INR 1.1   Cardiac Enzymes: No results for input(s): "CKTOTAL", "CKMB", "CKMBINDEX", "TROPONINI" in the last 168 hours.  BNP (last 3 results) No results for input(s): "PROBNP" in the last 8760 hours. HbA1C: No results for input(s): "HGBA1C" in the last 72 hours. CBG: Recent Labs  Lab 08/09/22 0152  GLUCAP 211*   Lipid Profile: Recent Labs    08/09/22 0155  CHOL 241*  HDL 43  LDLCALC 147*  TRIG 255*  CHOLHDL 5.6   Thyroid Function Tests: No results for input(s): "TSH", "T4TOTAL", "FREET4", "T3FREE", "THYROIDAB" in the last 72 hours. Anemia Panel: No results for input(s): "VITAMINB12", "FOLATE", "FERRITIN", "TIBC", "IRON", "RETICCTPCT" in the last 72 hours. Sepsis Labs: No results for input(s): "PROCALCITON",  "LATICACIDVEN" in the last 168 hours.  No results found for this or any previous visit (from the past 240 hour(s)).       Radiology Studies: MR BRAIN WO CONTRAST  Result Date: 08/09/2022 CLINICAL DATA:  Acute neurologic deficit EXAM: MRI HEAD WITHOUT CONTRAST TECHNIQUE: Multiplanar, multiecho pulse sequences of the brain and surrounding structures were obtained without intravenous contrast. COMPARISON:  None Available. FINDINGS: Brain: Small acute/early subacute infarct of the right thalamus. No acute or chronic hemorrhage. There is multifocal hyperintense T2-weighted signal within the white matter. Parenchymal volume and CSF spaces are normal. Old small vessel infarct of the posterior right lentiform nucleus. The midline structures are normal. Vascular: Major flow voids are preserved. Skull and upper cervical spine: Normal calvarium and skull base. Visualized upper cervical spine and soft tissues are normal. Sinuses/Orbits:No paranasal sinus fluid levels or advanced mucosal thickening. No mastoid or middle ear effusion. Normal orbits. IMPRESSION: Small acute/early subacute infarct of the right thalamus. No hemorrhage or mass effect. Electronically Signed   By: Deatra Robinson M.D.   On: 08/09/2022 01:46   CT Angio Head Neck W WO CM  Result Date: 08/09/2022 CLINICAL DATA:  Left facial numbness and tingling EXAM: CT ANGIOGRAPHY HEAD AND NECK TECHNIQUE: Multidetector CT imaging of the head and neck was performed using the standard protocol during bolus administration of intravenous contrast. Multiplanar CT image reconstructions and MIPs were obtained to evaluate the vascular anatomy. Carotid stenosis measurements (when applicable) are obtained utilizing NASCET criteria, using the distal internal carotid diameter as the denominator. RADIATION DOSE REDUCTION: This exam was performed according to the departmental dose-optimization program which includes automated exposure control, adjustment of the mA and/or  kV according to patient size and/or use of iterative reconstruction technique. CONTRAST:  58mL OMNIPAQUE IOHEXOL 350 MG/ML SOLN COMPARISON:  None Available. FINDINGS: CTA NECK FINDINGS SKELETON: There is no bony spinal canal stenosis. No lytic or blastic lesion. OTHER NECK: Normal pharynx, larynx and major salivary glands. No cervical lymphadenopathy. Unremarkable thyroid gland. UPPER CHEST: No pneumothorax or pleural effusion. No nodules or masses. AORTIC ARCH: There is no calcific atherosclerosis of the aortic arch. There is no aneurysm, dissection or hemodynamically significant stenosis of the visualized portion of the aorta. Conventional 3 vessel aortic branching pattern. The visualized proximal subclavian arteries are widely patent. RIGHT CAROTID SYSTEM: Normal without aneurysm, dissection or stenosis. LEFT CAROTID SYSTEM: Normal without aneurysm, dissection or stenosis. VERTEBRAL ARTERIES: Left dominant configuration. Both origins are clearly patent. There is no dissection, occlusion or flow-limiting stenosis to the skull base (V1-V3 segments). CTA HEAD FINDINGS POSTERIOR CIRCULATION: --Vertebral arteries: Normal V4 segments. --Inferior cerebellar arteries: Normal. --Basilar artery: Normal. --Superior cerebellar arteries: Normal. --Posterior cerebral arteries (PCA): Normal. ANTERIOR CIRCULATION: --Intracranial internal carotid arteries: Normal. --Anterior cerebral arteries (ACA): Normal. Both A1 segments are present. Patent anterior communicating artery (a-comm). --Middle cerebral arteries (MCA): Normal. VENOUS SINUSES: As permitted  by contrast timing, patent. ANATOMIC VARIANTS: Azygous configuration of the anterior cerebral artery. Review of the MIP images confirms the above findings. IMPRESSION: 1. No emergent large vessel occlusion or high-grade stenosis of the intracranial arteries. 2. Normal CTA of the neck. Electronically Signed   By: Deatra Robinson M.D.   On: 08/09/2022 00:01   CT HEAD WO  CONTRAST  Result Date: 08/08/2022 CLINICAL DATA:  56 year old male with LEFT facial numbness. EXAM: CT HEAD WITHOUT CONTRAST TECHNIQUE: Contiguous axial images were obtained from the base of the skull through the vertex without intravenous contrast. RADIATION DOSE REDUCTION: This exam was performed according to the departmental dose-optimization program which includes automated exposure control, adjustment of the mA and/or kV according to patient size and/or use of iterative reconstruction technique. COMPARISON:  12/22/2021 CT and prior studies FINDINGS: Brain: An age indeterminate RIGHT thalamic infarct appears in a slightly different location from the infarct identified on 12/22/2021 MR. There is no evidence of hemorrhage, mass lesion or mass effect, hydrocephalus, midline shift or extra-axial collection. Mild atrophy and mild chronic small-vessel white matter ischemic changes again noted. Vascular: Carotid atherosclerotic calcifications are noted. Skull: Normal. Negative for fracture or focal lesion. Sinuses/Orbits: No acute finding. Other: None. IMPRESSION: 1. Age indeterminate RIGHT thalamic infarct. No evidence of hemorrhage. 2. Mild atrophy and mild chronic small-vessel white matter ischemic changes. Electronically Signed   By: Harmon Pier M.D.   On: 08/08/2022 17:20        Scheduled Meds:  [START ON 08/10/2022]  stroke: early stages of recovery book   Does not apply Once   aspirin EC  81 mg Oral Daily   atorvastatin  80 mg Oral Daily   clopidogrel  75 mg Oral Daily   empagliflozin  10 mg Oral Daily   enoxaparin (LOVENOX) injection  40 mg Subcutaneous Q24H   insulin aspart  0-15 Units Subcutaneous TID WC   insulin aspart  0-5 Units Subcutaneous QHS   insulin glargine-yfgn  16 Units Subcutaneous Daily   sertraline  50 mg Oral Daily   Continuous Infusions:   LOS: 0 days    Time spent: 35 mins     Charise Killian, MD [facility] Pager [phone]  If 7PM-7AM, please  contact night-coverage [email or web address] 08/09/2022, 8:15 AM

## 2022-08-09 NOTE — Evaluation (Signed)
Physical Therapy Evaluation Patient Details Name: Roy Walker MRN: [id number] DOB: [birth year] Today's Date: 08/09/2022  History of Present Illness  Pt is a 56 y.o. male presenting to hospital 08/08/22 with c/o paresthesias and tingling sensation to L side of body (face, arm, and leg).  H/o TIA/CVA in April earlier this year (no residual deficits).  MRI of brain showing small acute/early subacute infarct of R thalamus.  Pt admitted with acute CVA.  PMH includes DM, htn, diastolic dysfunction, hospitalizaed April 2023 with thalamic stroke presenting with dizziness.  Clinical Impression  Prior to hospital admission, pt was independent with functional mobility; lives with his wife and brother.  Decreased sensation noted L UE/LE (pt educated on precautions to prevent burns and injuries to skin d/t impaired sensation: pt verbalizing appropriate understanding).  Currently pt is independent with bed mobility, transfers, and ambulation 200 feet (no AD use).  Pt steady with dynamic standing activities (pt even started dancing in hallway).  Pt's BP 162/110 prior to mobility and 176/100 post activity (nurse notified of pt's elevated BP).  No acute PT needs identified; will sign off at this time (pt verbalizing agreement with plan).   Recommendations for follow up therapy are one component of a multi-disciplinary discharge planning process, led by the attending physician.  Recommendations may be updated based on patient status, additional functional criteria and insurance authorization.  Follow Up Recommendations No PT follow up      Assistance Recommended at Discharge None  Patient can return home with the following       Equipment Recommendations None recommended by PT  Recommendations for Other Services       Functional Status Assessment Patient has not had a recent decline in their functional status     Precautions / Restrictions Precautions Precautions: None Restrictions Weight Bearing  Restrictions: No      Mobility  Bed Mobility Overal bed mobility: Independent             General bed mobility comments: no difficulties noted    Transfers Overall transfer level: Independent Equipment used: None               General transfer comment: steady safe transfers noted    Ambulation/Gait Ambulation/Gait assistance: Independent Gait Distance (Feet): 200 Feet Assistive device: None Gait Pattern/deviations: WFL(Within Functional Limits)       General Gait Details: steady ambulation  Stairs Stairs:  (Deferred d/t pt in ED (no difficulties anticipated))          Wheelchair Mobility    Modified Rankin (Stroke Patients Only)       Balance Overall balance assessment: Needs assistance Sitting-balance support: No upper extremity supported, Feet supported Sitting balance-Leahy Scale: Normal Sitting balance - Comments: steady sitting reaching outside BOS   Standing balance support: No upper extremity supported, During functional activity Standing balance-Leahy Scale: Normal Standing balance comment: steady ambulation                             Pertinent Vitals/Pain Pain Assessment Pain Assessment: No/denies pain HR and O2 sats WFL on room air during sessions activities.    Home Living Family/patient expects to be discharged to:: Private residence Living Arrangements: Spouse/significant other;Other relatives (wife and brother) Available Help at Discharge: Family;Available PRN/intermittently Type of Home: House Home Access: Stairs to enter Entrance Stairs-Rails: Left (L rail descending) Entrance Stairs-Number of Steps: 10 steps down to home (in basement)   Home Layout: Multi-level Home Equipment:  Cane - single point      Prior Function Prior Level of Function : Independent/Modified Independent             Mobility Comments: (+) working       Higher education careers adviser        Extremity/Trunk Assessment   Upper Extremity  Assessment Upper Extremity Assessment: Defer to OT evaluation (decreased sensation L UE (most pronounced in palm of L hand); intact B UE strength)    Lower Extremity Assessment Lower Extremity Assessment:  (mild decreased sensation L LE; intact B LE heel to shin coordination, tone, strength, and proprioception)    Cervical / Trunk Assessment Cervical / Trunk Assessment: Normal  Communication   Communication: No difficulties  Cognition Arousal/Alertness: Awake/alert Behavior During Therapy: WFL for tasks assessed/performed Overall Cognitive Status: Within Functional Limits for tasks assessed                                          General Comments  Nursing cleared pt for participation in physical therapy.  Pt agreeable to PT session.  Pt's wife and brother present during session.    Exercises     Assessment/Plan    PT Assessment Patient does not need any further PT services  PT Problem List         PT Treatment Interventions      PT Goals (Current goals can be found in the Care Plan section)  Acute Rehab PT Goals Patient Stated Goal: to go home PT Goal Formulation: With patient Time For Goal Achievement: 08/23/22 Potential to Achieve Goals: Good    Frequency       Co-evaluation               AM-PAC PT "6 Clicks" Mobility  Outcome Measure Help needed turning from your back to your side while in a flat bed without using bedrails?: None Help needed moving from lying on your back to sitting on the side of a flat bed without using bedrails?: None Help needed moving to and from a bed to a chair (including a wheelchair)?: None Help needed standing up from a chair using your arms (e.g., wheelchair or bedside chair)?: None Help needed to walk in hospital room?: None Help needed climbing 3-5 steps with a railing? : None 6 Click Score: 24    End of Session   Activity Tolerance: Patient tolerated treatment well Patient left: in bed;with call  bell/phone within reach;with bed alarm set;with family/visitor present Nurse Communication: Mobility status;Precautions;Other (comment) (pt's BP during session) PT Visit Diagnosis: Other symptoms and signs involving the nervous system (W25.852)    Time: 7782-4235 PT Time Calculation (min) (ACUTE ONLY): 22 min   Charges:   PT Evaluation $PT Eval Low Complexity: 1 Low         Ernestine Rohman, PT 08/09/22, 12:58 PM

## 2022-08-09 NOTE — Progress Notes (Signed)
SLP Cancellation Note  Patient Details Name: Roy Walker MRN: [id number] DOB: 1965/12/04   Cancelled treatment:       Reason Eval/Treat Not Completed: SLP screened, no needs identified, will sign off   SLP consult received and appreciated. Chart review completed. Per OT report, pt at baseline level of independence speech/language/cognition. Only remaining deficit is numbness in L palm.  [name], M.S., CCC-SLP Speech-Language Pathologist [facility] [phone] [name])   [name] 08/09/2022, 12:42 PM

## 2022-08-09 NOTE — ED Notes (Signed)
56 yom lying supine in the bed with his head slightly elevated. The pt was warm, pink, and dry. The pt denied needing any assistance.

## 2022-08-10 DIAGNOSIS — E1169 Type 2 diabetes mellitus with other specified complication: Secondary | ICD-10-CM | POA: Diagnosis not present

## 2022-08-10 DIAGNOSIS — Z794 Long term (current) use of insulin: Secondary | ICD-10-CM | POA: Diagnosis not present

## 2022-08-10 DIAGNOSIS — I639 Cerebral infarction, unspecified: Secondary | ICD-10-CM | POA: Diagnosis not present

## 2022-08-10 DIAGNOSIS — I10 Essential (primary) hypertension: Secondary | ICD-10-CM | POA: Diagnosis not present

## 2022-08-10 LAB — BASIC METABOLIC PANEL
Anion gap: 4 — ABNORMAL LOW (ref 5–15)
BUN: 18 mg/dL (ref 6–20)
CO2: 31 mmol/L (ref 22–32)
Calcium: 9.4 mg/dL (ref 8.9–10.3)
Chloride: 107 mmol/L (ref 98–111)
Creatinine, Ser: 0.59 mg/dL — ABNORMAL LOW (ref 0.61–1.24)
GFR, Estimated: >60 mL/min (ref >60)
Glucose, Bld: 258 mg/dL — ABNORMAL HIGH (ref 70–99)
Potassium: 4.1 mmol/L (ref 3.5–5.1)
Sodium: 142 mmol/L (ref 135–145)

## 2022-08-10 LAB — CBC
HCT: 50.4 % (ref 39.0–52.0)
Hemoglobin: 16.6 g/dL (ref 13.0–17.0)
MCH: 31.3 pg (ref 26.0–34.0)
MCHC: 32.9 g/dL (ref 30.0–36.0)
MCV: 95.1 fL (ref 80.0–100.0)
Platelets: 220 10*3/uL (ref 150–400)
RBC: 5.3 MIL/uL (ref 4.22–5.81)
RDW: 12.8 % (ref 11.5–15.5)
WBC: 9.2 10*3/uL (ref 4.0–10.5)
nRBC: 0 % (ref 0.0–0.2)

## 2022-08-10 LAB — GLUCOSE, CAPILLARY
Glucose-Capillary: 246 mg/dL — ABNORMAL HIGH (ref 70–99)
Glucose-Capillary: 302 mg/dL — ABNORMAL HIGH (ref 70–99)

## 2022-08-10 LAB — HEMOGLOBIN A1C
Hgb A1c MFr Bld: 10.3 % — ABNORMAL HIGH (ref 4.8–5.6)
Mean Plasma Glucose: 249 mg/dL

## 2022-08-10 MED ORDER — AMLODIPINE BESYLATE 5 MG PO TABS
5.0000 mg | ORAL_TABLET | Freq: Every day | ORAL | Status: DC
  Administered 2022-08-10: 5 mg via ORAL
  Filled 2022-08-10: qty 1

## 2022-08-10 MED ORDER — HYDRALAZINE HCL 20 MG/ML IJ SOLN
20.0000 mg | Freq: Once | INTRAMUSCULAR | Status: AC
  Administered 2022-08-10: 20 mg via INTRAVENOUS
  Filled 2022-08-10: qty 1

## 2022-08-10 MED ORDER — LOSARTAN POTASSIUM 50 MG PO TABS
50.0000 mg | ORAL_TABLET | Freq: Every day | ORAL | Status: DC
  Administered 2022-08-10: 50 mg via ORAL
  Filled 2022-08-10: qty 1

## 2022-08-10 NOTE — Consult Note (Signed)
Neurology Consultation Reason for Consult: Stroke Referring Physician: Wilfred Lacy  CC: Numbness  History is obtained from: Patient  HPI: Roy Walker is a 56 y.o. male who was in his normal state of health until he woke up on 5/3 with numbness in the left side.  He states that since that time, it is actually improved some, but is still present on the left side of his face and the left hand.  He denies any weakness, difficulty walking or swallowing or other symptoms.   LKW: 12/3 prior to bed tpa given?: no, out of window  Past Medical History:  Diagnosis Date   Diabetes mellitus without complication (HCC)    Hypertension     History reviewed. No pertinent family history.   Social History:  reports that he quit smoking about 16 years ago. His smoking use included cigarettes. He has never used smokeless tobacco. He reports current alcohol use of about 10.0 standard drinks of alcohol per week. He reports that he does not use drugs.   Exam: Current vital signs: BP (!) 171/97 (BP Location: Left Arm)   Pulse 64   Temp 98 F (36.7 C)   Resp 16   Ht 5\' 7"  (1.702 m)   Wt 86.6 kg   SpO2 95%   BMI 29.90 kg/m  Vital signs in last 24 hours: Temp:  [98 F (36.7 C)-98.4 F (36.9 C)] 98 F (36.7 C) (12/05 0804) Pulse Rate:  [58-77] 64 (12/05 0804) Resp:  [14-19] 16 (12/05 0804) BP: (113-176)/(69-101) 171/97 (12/05 0804) SpO2:  [94 %-99 %] 95 % (12/05 0804) Weight:  [86.6 kg] 86.6 kg (12/05 0543)   Physical Exam  Constitutional: Appears well-developed and well-nourished.  Psych: Affect appropriate to situation Eyes: No scleral injection HENT: No OP obstruction MSK: no joint deformities.  Cardiovascular: Normal rate and regular rhythm.  Respiratory: Effort normal, non-labored breathing GI: Soft.  No distension. There is no tenderness.  Skin: WDI  Neuro: Mental Status: Patient is awake, alert, oriented to person, place, month, year, and situation. Patient is able  to give a clear and coherent history. No signs of aphasia or neglect Cranial Nerves: II: Visual Fields are full. Pupils are equal, round, and reactive to light.   III,IV, VI: EOMI without ptosis or diploplia.  V: Facial sensation is diminished on the left VII: Facial movement is symmetric.  VIII: hearing is intact to voice X: Uvula elevates symmetrically XI: Shoulder shrug is symmetric. XII: tongue is midline without atrophy or fasciculations.  Motor: Tone is normal. Bulk is normal. 5/5 strength was present in all four extremities. Sensory: Sensation is diminished in the left hand Deep Tendon Reflexes: 2+ and symmetric in the biceps and patellae.  Cerebellar: FNF and HKS are intact bilaterally      I have reviewed labs in epic and the results pertinent to this consultation are: LDL 147 A1C 10.3  I have reviewed the images obtained: MRI brain - small vessel infarct in right thalamus.   Impression: 56 yo M with small vessel infarct in the setting of hypertension, DM, hyperlipidemia, poor compliance. I stressed the importance of getting these risk factors under control as secondary prevention rests predominantly on controlling these   Recommendations: 1) ASA 81 mg and plavix 75mg  daily x 3 weeks, followed by ASA monotherapy(though if he has another indication for DAPT, no objection to continuing this) 2) Lipid control with high intensity atorvastatin 80 mg daily(was not taking prior to admission), Goal LDL < 70 3) DM  control with goal A1C < 7 4) Hypertension control.  5) Neurology will be available as needed.   Ritta Slot, MD [facility] [phone]  If 7pm- 7am, please page neurology on call as listed in AMION.

## 2022-08-10 NOTE — Discharge Summary (Signed)
Physician Discharge Summary  Roy Walker KXF:[id number] DOB: 03-08-1966 DOA: 08/08/2022  PCP: [facility] date: 08/08/2022 Discharge date: 08/10/2022  Admitted From: home  Disposition:  home   Recommendations for Outpatient Follow-up:  Follow up with PCP in 1-2 weeks F/u w/ neuro, Dr. Melrose Walker or neurologist of your choice, in 2-3 weeks    Home Health: no  Equipment/Devices:  Discharge Condition: stable  CODE STATUS: full  Diet recommendation: Heart Healthy / Carb Modified  Brief/Interim Summary: HPI was taken from Dr. Damita Walker: Roy Walker is a 56 y.o. male with medical history significant for Diabetes and hypertension, diastolic dysfunction, hospitalized in April 2023 with a thalamic stroke presenting with dizziness who presented to the ED after waking at 6 AM on the morning of 08/08/2022 with left-sided hemibody numbness including face, arm and leg.  He denied weakness, slurred speech.  He was in his usual state of health when he went to bed the night prior.  Patient stated he had not taken his aspirin in a week.  Additionally on reviewing his medication, atorvastatin was not among his meds. ED course and data review: BP 160/109 with otherwise normal vitals.  Labs significant for glucose of 258 WBC 11,200.  CMP, CBC, EtOH otherwise unremarkable.  EKG, personally viewed and interpreted shows sinus at 80 with nonspecific ST-T wave changes. CT head shows age-indeterminate right thalamic infarct with no evidence of hemorrhage CTA head and neck shows the following: IMPRESSION: 1. No emergent large vessel occlusion or high-grade stenosis of the intracranial arteries. 2. Normal CTA of the neck.   Hospitalist consulted for admission   As per Dr. Jimmye Walker 12/4-12/5/23: Pt was found to have right thalamus CVA on admission. Pt was treated w/ aspirin, plavix & statin as pt was outside of tPA window. PT/OT did not recommend any further therapy.  Speech was consulted but pt was at baseline for speech/language/cognition so speech was not needed. Pt will continue on aspirin, plavix x 3 weeks and then aspirin monotherapy thereafter as per neuro. Pt will continue on atorvastatin 77m daily that was previously prescribed to pt but pt was not taking. For more information, please see previous progress/consult notes for more information.   Discharge Diagnoses:  Principal Problem:   Acute CVA (cerebrovascular accident) ([facility] Active Problems:   Uncontrolled type 2 diabetes mellitus with hyperglycemia, with long-term current use of insulin (HCC)   Essential hypertension   Chronic diastolic CHF (congestive heart failure) ([facility]   History of CVA (cerebrovascular accident) 12/2021  Acute vs subacute CVA: of right thalamus as per MRI brain. Restart home dose of losartan, amlodipine. Continue on aspirin, plavix x 3 weeks and then just aspirin monotherapy thereafter as per neuro. Continue on statin. Echo April 2023 that showed grade 2 diastolic dysfunction. Continue w/ neuro checks. PT/OT does not need any further therapy.  Speech signed off. Hx of previous CVA 12/2021. Neuro  following and recs apprec    DM2: poorly controlled, HbA1c is 10.3. Continue on glargine, SSI w/ accuchecks    Chronic diastolic CHF: appears euvolemic. Continue on losartan. Monitor I/Os   HTN: continue on losartan, amlodipine   Discharge Instructions  Discharge Instructions     Diet - low sodium heart healthy   Complete by: As directed    Diet Carb Modified   Complete by: As directed    Discharge instructions   Complete by: As directed    F/u w/ PCP in 1-2 weeks. F/u w/ neuro, Dr. PMelrose Nakayamaor  neurologist of your choice, in 2-3 weeks   Increase activity slowly   Complete by: As directed       Allergies as of 08/10/2022   No Known Allergies      Medication List     TAKE these medications    amLODipine 5 MG tablet Commonly known as: NORVASC Take 5 mg by mouth  daily.   aspirin EC 81 MG tablet Take 1 tablet (81 mg total) by mouth daily. Swallow whole.   atorvastatin 80 MG tablet Commonly known as: LIPITOR Take 1 tablet (80 mg total) by mouth daily.   Basaglar KwikPen 100 UNIT/ML Inject 16 Units into the skin daily.   blood glucose meter kit and supplies Kit Dispense based on patient and insurance preference. Check blood sugar three times daily before meals.   clopidogrel 75 MG tablet Commonly known as: PLAVIX Take 1 tablet (75 mg total) by mouth daily.   cyclobenzaprine 5 MG tablet Commonly known as: FLEXERIL Take 5 mg by mouth 3 (three) times daily as needed.   fenofibrate 160 MG tablet Take 1 tablet (160 mg total) by mouth daily.   glimepiride 2 MG tablet Commonly known as: AMARYL Take 2 mg by mouth daily with breakfast.   insulin aspart 100 UNIT/ML injection Commonly known as: novoLOG Inject 3 Units into the skin 3 (three) times daily with meals.   Jardiance 10 MG Tabs tablet Generic drug: empagliflozin Take 10 mg by mouth daily.   losartan 50 MG tablet Commonly known as: COZAAR Take 1 tablet (50 mg total) by mouth daily.   metFORMIN 1000 MG tablet Commonly known as: GLUCOPHAGE Take 1,000 mg by mouth 2 (two) times daily.   Valtrex 1000 MG tablet Generic drug: valACYclovir Take by mouth 2 (two) times daily.   Zoloft 50 MG tablet Generic drug: sertraline Take 50 mg by mouth daily.        Follow-up Information     Roy Bene, MD Follow up.   Specialty: Neurology Why: F/u in 2-3 weeks Contact information: [address] [facility] [place] [address] [phone]         [facility] Follow up.   Specialty: General Practice Why: F/u in 1-2 weeks Contact information: [address] [place] [address] [phone]                No Known Allergies  Consultations: Neuro    Procedures/Studies: MR BRAIN WO  CONTRAST  Result Date: 08/09/2022 CLINICAL DATA:  Acute neurologic deficit EXAM: MRI HEAD WITHOUT CONTRAST TECHNIQUE: Multiplanar, multiecho pulse sequences of the brain and surrounding structures were obtained without intravenous contrast. COMPARISON:  None Available. FINDINGS: Brain: Small acute/early subacute infarct of the right thalamus. No acute or chronic hemorrhage. There is multifocal hyperintense T2-weighted signal within the white matter. Parenchymal volume and CSF spaces are normal. Old small vessel infarct of the posterior right lentiform nucleus. The midline structures are normal. Vascular: Major flow voids are preserved. Skull and upper cervical spine: Normal calvarium and skull base. Visualized upper cervical spine and soft tissues are normal. Sinuses/Orbits:No paranasal sinus fluid levels or advanced mucosal thickening. No mastoid or middle ear effusion. Normal orbits. IMPRESSION: Small acute/early subacute infarct of the right thalamus. No hemorrhage or mass effect. Electronically Signed   By: Ulyses Jarred M.D.   On: 08/09/2022 01:46   CT Angio Head Neck W WO CM  Result Date: 08/09/2022 CLINICAL DATA:  Left facial numbness and tingling  EXAM: CT ANGIOGRAPHY HEAD AND NECK TECHNIQUE: Multidetector CT imaging of the head and neck was performed using the standard protocol during bolus administration of intravenous contrast. Multiplanar CT image reconstructions and MIPs were obtained to evaluate the vascular anatomy. Carotid stenosis measurements (when applicable) are obtained utilizing NASCET criteria, using the distal internal carotid diameter as the denominator. RADIATION DOSE REDUCTION: This exam was performed according to the departmental dose-optimization program which includes automated exposure control, adjustment of the mA and/or kV according to patient size and/or use of iterative reconstruction technique. CONTRAST:  73m OMNIPAQUE IOHEXOL 350 MG/ML SOLN COMPARISON:  None Available.  FINDINGS: CTA NECK FINDINGS SKELETON: There is no bony spinal canal stenosis. No lytic or blastic lesion. OTHER NECK: Normal pharynx, larynx and major salivary glands. No cervical lymphadenopathy. Unremarkable thyroid gland. UPPER CHEST: No pneumothorax or pleural effusion. No nodules or masses. AORTIC ARCH: There is no calcific atherosclerosis of the aortic arch. There is no aneurysm, dissection or hemodynamically significant stenosis of the visualized portion of the aorta. Conventional 3 vessel aortic branching pattern. The visualized proximal subclavian arteries are widely patent. RIGHT CAROTID SYSTEM: Normal without aneurysm, dissection or stenosis. LEFT CAROTID SYSTEM: Normal without aneurysm, dissection or stenosis. VERTEBRAL ARTERIES: Left dominant configuration. Both origins are clearly patent. There is no dissection, occlusion or flow-limiting stenosis to the skull base (V1-V3 segments). CTA HEAD FINDINGS POSTERIOR CIRCULATION: --Vertebral arteries: Normal V4 segments. --Inferior cerebellar arteries: Normal. --Basilar artery: Normal. --Superior cerebellar arteries: Normal. --Posterior cerebral arteries (PCA): Normal. ANTERIOR CIRCULATION: --Intracranial internal carotid arteries: Normal. --Anterior cerebral arteries (ACA): Normal. Both A1 segments are present. Patent anterior communicating artery (a-comm). --Middle cerebral arteries (MCA): Normal. VENOUS SINUSES: As permitted by contrast timing, patent. ANATOMIC VARIANTS: Azygous configuration of the anterior cerebral artery. Review of the MIP images confirms the above findings. IMPRESSION: 1. No emergent large vessel occlusion or high-grade stenosis of the intracranial arteries. 2. Normal CTA of the neck. Electronically Signed   By: KUlyses JarredM.D.   On: 08/09/2022 00:01   CT HEAD WO CONTRAST  Result Date: 08/08/2022 CLINICAL DATA:  56year old male with LEFT facial numbness. EXAM: CT HEAD WITHOUT CONTRAST TECHNIQUE: Contiguous axial images were  obtained from the base of the skull through the vertex without intravenous contrast. RADIATION DOSE REDUCTION: This exam was performed according to the departmental dose-optimization program which includes automated exposure control, adjustment of the mA and/or kV according to patient size and/or use of iterative reconstruction technique. COMPARISON:  12/22/2021 CT and prior studies FINDINGS: Brain: An age indeterminate RIGHT thalamic infarct appears in a slightly different location from the infarct identified on 12/22/2021 MR. There is no evidence of hemorrhage, mass lesion or mass effect, hydrocephalus, midline shift or extra-axial collection. Mild atrophy and mild chronic small-vessel white matter ischemic changes again noted. Vascular: Carotid atherosclerotic calcifications are noted. Skull: Normal. Negative for fracture or focal lesion. Sinuses/Orbits: No acute finding. Other: None. IMPRESSION: 1. Age indeterminate RIGHT thalamic infarct. No evidence of hemorrhage. 2. Mild atrophy and mild chronic small-vessel white matter ischemic changes. Electronically Signed   By: JMargarette CanadaM.D.   On: 08/08/2022 17:20   (Echo, Carotid, EGD, Colonoscopy, ERCP)    Subjective: pt denies any complaints   Discharge Exam: Vitals:   08/10/22 1309 08/10/22 1425  BP: (!) 164/102 (!) 158/99  Pulse: 65 82  Resp:    Temp:  98.5 F (36.9 C)  SpO2: 98% 98%   Vitals:   08/10/22 1152 08/10/22 1300 08/10/22 1309 08/10/22 1425  BP: (!) 161/93 (!) 164/102 (!) 164/102 (!) 158/99  Pulse: 65 66 65 82  Resp: 16 16    Temp: 98.1 F (36.7 C)   98.5 F (36.9 C)  TempSrc:    Oral  SpO2: 93% 96% 98% 98%  Weight:      Height:        General: Pt is alert, awake, not in acute distress Cardiovascular: S1/S2 +, no rubs, no gallops Respiratory: CTA bilaterally, no wheezing, no rhonchi Abdominal: Soft, NT, overweight, bowel sounds + Extremities: no edema, no cyanosis    The results of significant diagnostics from this  hospitalization (including imaging, microbiology, ancillary and laboratory) are listed below for reference.     Microbiology: No results found for this or any previous visit (from the past 240 hour(s)).   Labs: BNP (last 3 results) No results for input(s): "BNP" in the last 8760 hours. Basic Metabolic Panel: Recent Labs  Lab 08/08/22 1614 08/09/22 0155 08/10/22 0514  NA 138  --  142  K 3.8  --  4.1  CL 104  --  107  CO2 26  --  31  GLUCOSE 258*  --  258*  BUN 22*  --  18  CREATININE 0.68 0.50* 0.59*  CALCIUM 9.4  --  9.4   Liver Function Tests: Recent Labs  Lab 08/08/22 1614  AST 45*  ALT 34  ALKPHOS 97  BILITOT 1.0  PROT 7.4  ALBUMIN 4.2   No results for input(s): "LIPASE", "AMYLASE" in the last 168 hours. No results for input(s): "AMMONIA" in the last 168 hours. CBC: Recent Labs  Lab 08/08/22 1614 08/09/22 0155 08/10/22 0514  WBC 11.2* 10.2 9.2  NEUTROABS 6.5  --   --   HGB 16.5 15.7 16.6  HCT 48.6 47.1 50.4  MCV 93.6 94.6 95.1  PLT 246 230 220   Cardiac Enzymes: No results for input(s): "CKTOTAL", "CKMB", "CKMBINDEX", "TROPONINI" in the last 168 hours. BNP: Invalid input(s): "POCBNP" CBG: Recent Labs  Lab 08/09/22 1146 08/09/22 1642 08/09/22 2143 08/10/22 0806 08/10/22 1147  GLUCAP 313* 209* 259* 246* 302*   D-Dimer No results for input(s): "DDIMER" in the last 72 hours. Hgb A1c Recent Labs    08/09/22 0155  HGBA1C 10.3*   Lipid Profile Recent Labs    08/09/22 0155  CHOL 241*  HDL 43  LDLCALC 147*  TRIG 255*  CHOLHDL 5.6   Thyroid function studies No results for input(s): "TSH", "T4TOTAL", "T3FREE", "THYROIDAB" in the last 72 hours.  Invalid input(s): "FREET3" Anemia work up No results for input(s): "VITAMINB12", "FOLATE", "FERRITIN", "TIBC", "IRON", "RETICCTPCT" in the last 72 hours. Urinalysis    Component Value Date/Time   COLORURINE STRAW (A) 12/22/2021 0245   APPEARANCEUR CLEAR (A) 12/22/2021 0245   LABSPEC 1.029  12/22/2021 0245   PHURINE 5.0 12/22/2021 0245   GLUCOSEU >=500 (A) 12/22/2021 0245   HGBUR NEGATIVE 12/22/2021 0245   BILIRUBINUR NEGATIVE 12/22/2021 0245   KETONESUR NEGATIVE 12/22/2021 0245   PROTEINUR NEGATIVE 12/22/2021 0245   NITRITE NEGATIVE 12/22/2021 0245   LEUKOCYTESUR NEGATIVE 12/22/2021 0245   Sepsis Labs Recent Labs  Lab 08/08/22 1614 08/09/22 0155 08/10/22 0514  WBC 11.2* 10.2 9.2   Microbiology No results found for this or any previous visit (from the past 240 hour(s)).   Time coordinating discharge: Over 30 minutes  SIGNED:   Wyvonnia Dusky, MD  [facility] 08/10/2022, 2:54 PM Pager   If 7PM-7AM, please contact night-coverage [email or web address]

## 2022-08-10 NOTE — Inpatient Diabetes Management (Signed)
Inpatient Diabetes Program Recommendations  AACE/ADA: New Consensus Statement on Inpatient Glycemic Control   Target Ranges:  Prepandial:   less than 140 mg/dL      Peak postprandial:   less than 180 mg/dL (1-2 hours)      Critically ill patients:  140 - 180 mg/dL    Latest Reference Range & Units 08/09/22 08:16 08/09/22 11:46 08/09/22 16:42 08/09/22 21:43 08/10/22 08:06 08/10/22 11:47  Glucose-Capillary 70 - 99 mg/dL 952 (H) 841 (H) 324 (H) 259 (H) 246 (H) 302 (H)    Latest Reference Range & Units 12/22/21 02:42 08/09/22 01:55  Hemoglobin A1C 4.8 - 5.6 % 14.0 (H) 10.3 (H)   Review of Glycemic Control  Diabetes history: DM2 Outpatient Diabetes medications: Amaryl 2 mg QAM, Novolog 12 units TID with meals, Basaglar 24 units QHS, Jardiance 10 mg daily, Metformin 1000 mg BID Current orders for Inpatient glycemic control: Semglee 16 units daily, Novolog 0-15 units TID with meals, Novolog 0-5 units QHS  Inpatient Diabetes Program Recommendations:    Insulin: Please consider increasing Semglee to 20 units daily and adding Novolog 5 units TID with meals for meal coverage if patient eats at least 50% of meals.  HbgA1C:  A1C 10.3% on 08/09/22 indicating an average glucose of 249 mg/dl over the past 2-3 months.  NOTE: Spoke with patient at bedside about diabetes and home regimen for diabetes control. Patient reports being followed by PCP for diabetes management and currently taking Amaryl 2 mg QAM, Novolog 12 units TID with meals, Basaglar 24 units QHS, Jardiance 10 mg daily, and Metformin 1000 mg BID as an outpatient for diabetes control. Patient reports taking DM medications as prescribed and notes that he is good at taking the insulin but he forgets to take the oral medications at times. Patient reports checking glucose and it is usually 250 mg/dl or higher.  Inquired about prior A1C and patient reports not being able to recall last A1C value. Discussed A1C results (10.3% on 08/09/22) and explained  that current A1C indicates an average glucose of 249 mg/dl over the past 2-3 months. Informed patient that per chart, prior A1C was 14% on 12/22/21, so current A1C improved but still higher than goal. Discussed glucose and A1C goals. Discussed importance of checking CBGs and maintaining good CBG control to prevent long-term and short-term complications. Explained how hyperglycemia leads to damage within blood vessels which lead to the common complications seen with uncontrolled diabetes. Stressed to the patient the importance of improving glycemic control to prevent further complications from uncontrolled diabetes. Discussed impact of nutrition, exercise, stress, sickness, and medications on diabetes control.  Patient reports that he had a visit with PCP scheduled for 08/09/22 but he missed it due to being at the hospital. Patient plans to go by PCP office when he is discharged and make a new appointment for follow up. Encouraged patient to work with PCP regarding DM control.  Patient verbalized understanding of information discussed and reports no further questions at this time related to diabetes.  Thanks, Orlando Penner, RN, MSN, CDE Diabetes Coordinator Inpatient Diabetes Program [phone] (Team Pager)

## 2022-08-10 NOTE — Progress Notes (Signed)
Pt discharged in stable condition. Ambulated off unit per request

## 2023-01-30 ENCOUNTER — Other Ambulatory Visit: Payer: Self-pay | Admitting: Internal Medicine

## 2023-12-06 DEATH — deceased
